# Patient Record
Sex: Male | Born: 1945
Health system: Southern US, Community
[De-identification: ages and names within clinical notes are randomized; demographics above are authoritative.]

## PROBLEM LIST (undated history)

## (undated) ENCOUNTER — Emergency Department (HOSPITAL_BASED_OUTPATIENT_CLINIC_OR_DEPARTMENT_OTHER): Payer: Commercial Managed Care - HMO | Source: Home / Self Care

## (undated) DIAGNOSIS — E785 Hyperlipidemia, unspecified: Secondary | ICD-10-CM

## (undated) DIAGNOSIS — I10 Essential (primary) hypertension: Secondary | ICD-10-CM

## (undated) HISTORY — PX: ROTATOR CUFF REPAIR: SHX139

## (undated) HISTORY — DX: Hyperlipidemia, unspecified: E78.5

## (undated) HISTORY — PX: REVISION TOTAL HIP ARTHROPLASTY: SHX766

## (undated) HISTORY — DX: Essential (primary) hypertension: I10

---

## 2014-06-03 LAB — BASIC METABOLIC PANEL
BUN: 16 mg/dL (ref 4–21)
CREATININE: 1 mg/dL (ref 0.6–1.3)
Glucose: 100 mg/dL
Potassium: 4.7 mmol/L (ref 3.4–5.3)
Sodium: 147 mmol/L (ref 137–147)

## 2014-06-03 LAB — LIPID PANEL
Cholesterol: 153 mg/dL (ref 0–200)
HDL: 55 mg/dL (ref 35–70)
LDL CALC: 71 mg/dL
Triglycerides: 135 mg/dL (ref 40–160)

## 2014-06-03 LAB — HEPATIC FUNCTION PANEL
ALT: 41 U/L — AB (ref 10–40)
AST: 30 U/L (ref 14–40)
Alkaline Phosphatase: 76 U/L (ref 25–125)
Bilirubin, Total: 0.7 mg/dL

## 2014-06-03 LAB — CBC AND DIFFERENTIAL
HEMATOCRIT: 43 % (ref 41–53)
HEMOGLOBIN: 14.2 g/dL (ref 13.5–17.5)
Platelets: 260 10*3/uL (ref 150–399)
WBC: 7.6 10*3/mL

## 2014-08-05 ENCOUNTER — Ambulatory Visit (INDEPENDENT_AMBULATORY_CARE_PROVIDER_SITE_OTHER): Payer: Commercial Managed Care - HMO | Admitting: Sports Medicine

## 2014-08-05 ENCOUNTER — Encounter: Payer: Self-pay | Admitting: Sports Medicine

## 2014-08-05 VITALS — BP 158/89 | HR 77 | Ht 72.0 in | Wt 226.0 lb

## 2014-08-05 DIAGNOSIS — F320 Major depressive disorder, single episode, mild: Secondary | ICD-10-CM | POA: Diagnosis not present

## 2014-08-05 DIAGNOSIS — F329 Major depressive disorder, single episode, unspecified: Secondary | ICD-10-CM | POA: Insufficient documentation

## 2014-08-05 DIAGNOSIS — Z96641 Presence of right artificial hip joint: Secondary | ICD-10-CM | POA: Insufficient documentation

## 2014-08-05 DIAGNOSIS — Z0000 Encounter for general adult medical examination without abnormal findings: Secondary | ICD-10-CM | POA: Insufficient documentation

## 2014-08-05 DIAGNOSIS — C61 Malignant neoplasm of prostate: Secondary | ICD-10-CM | POA: Diagnosis not present

## 2014-08-05 DIAGNOSIS — Z96642 Presence of left artificial hip joint: Secondary | ICD-10-CM | POA: Insufficient documentation

## 2014-08-05 DIAGNOSIS — E785 Hyperlipidemia, unspecified: Secondary | ICD-10-CM

## 2014-08-05 DIAGNOSIS — M1611 Unilateral primary osteoarthritis, right hip: Secondary | ICD-10-CM | POA: Diagnosis not present

## 2014-08-05 DIAGNOSIS — I10 Essential (primary) hypertension: Secondary | ICD-10-CM | POA: Insufficient documentation

## 2014-08-05 MED ORDER — TRAMADOL HCL 50 MG PO TABS: ORAL_TABLET | ORAL | Status: DC

## 2014-08-05 MED ORDER — MELOXICAM 15 MG PO TABS: ORAL_TABLET | ORAL | Status: DC

## 2014-08-05 MED ORDER — TRAZODONE HCL 100 MG PO TABS: 100.0000 mg | ORAL_TABLET | Freq: Every day | ORAL | Status: DC

## 2014-08-05 MED ORDER — VENLAFAXINE HCL ER 37.5 MG PO CP24: ORAL_CAPSULE | ORAL | Status: DC

## 2014-08-05 MED ORDER — VENLAFAXINE HCL ER 75 MG PO CP24: 75.0000 mg | ORAL_CAPSULE | Freq: Every day | ORAL | Status: DC

## 2014-08-05 MED ORDER — ATORVASTATIN CALCIUM 40 MG PO TABS: 40.0000 mg | ORAL_TABLET | Freq: Every day | ORAL | Status: DC

## 2014-08-05 MED ORDER — LOSARTAN POTASSIUM 100 MG PO TABS: 100.0000 mg | ORAL_TABLET | Freq: Every day | ORAL | Status: DC

## 2014-08-05 MED ORDER — OMEPRAZOLE 40 MG PO CPDR: 40.0000 mg | DELAYED_RELEASE_CAPSULE | Freq: Every day | ORAL | Status: DC

## 2014-08-05 NOTE — Assessment & Plan Note (Signed)
Overall doing well but very weak

## 2014-08-05 NOTE — Assessment & Plan Note (Signed)
Up-to-date on colon cancer screening. We will discuss vaccinations at a future visit

## 2014-08-05 NOTE — Assessment & Plan Note (Signed)
Patient declines any further treatment or monitoring of his prostate cancer. Risks, benefits, alternatives explained, including risk of death and metastatic disease. Most recent PSA was 5.4 in April 2016. Patient declines referral to another urologist.

## 2014-08-05 NOTE — Assessment & Plan Note (Signed)
Currently well-controlled, refilling medication.

## 2014-08-05 NOTE — Progress Notes (Signed)
  Subjective:    CC: Establish care.   HPI:  Depression: For the past several months as noted moderate anhedonia, poor energy, poor appetite, mild depressed mood and mild guilt, no suicidal or homicidal ideation, amenable to starting medication.  Prostate cancer: Noted on biopsy, he has not been to see his urologist, he does not want any further treatment, and declines any further monitoring of his prostate cancer. He declines any further referral to urology. He understands the risks.  Right hip pain: Known hip osteoarthritis, persistent pain despite oral ibuprofen. Moderate, persistent, localized at the groin. Worse in the morning and with weightbearing. Left hip is post-arthroplasty  Past medical history, Surgical history, Family history not pertinant except as noted below, Social history, Allergies, and medications have been entered into the medical record, reviewed, and no changes needed.   Review of Systems: No headache, visual changes, nausea, vomiting, diarrhea, constipation, dizziness, abdominal pain, skin rash, fevers, chills, night sweats, swollen lymph nodes, weight loss, chest pain, body aches, joint swelling, muscle aches, shortness of breath, mood changes, visual or auditory hallucinations.  Objective:    General: Well Developed, well nourished, and in no acute distress.  Neuro: Alert and oriented x3, extra-ocular muscles intact, sensation grossly intact.  HEENT: Normocephalic, atraumatic, pupils equal round reactive to light, neck supple, no masses, no lymphadenopathy, thyroid nonpalpable.  Skin: Warm and dry, no rashes noted.  Cardiac: Regular rate and rhythm, no murmurs rubs or gallops.  Respiratory: Clear to auscultation bilaterally. Not using accessory muscles, speaking in full sentences.  Abdominal: Soft, nontender, nondistended, positive bowel sounds, no masses, no organomegaly.  Right Hip: ROM IR: 30 Deg, painful, ER: 60 Deg, Flexion: 120 Deg, Extension: 100 Deg,  Abduction: 45 Deg, Adduction: 45 Deg Strength IR: 5/5, ER: 5/5, Flexion: 5/5, Extension: 5/5, Abduction: 5/5, Adduction: 5/5 Pelvic alignment unremarkable to inspection and palpation. Standing hip rotation and gait without trendelenburg / unsteadiness. Greater trochanter without tenderness to palpation. No tenderness over piriformis. No SI joint tenderness and normal minimal SI movement.  Procedure: Real-time Ultrasound Guided Injection of right femoral acetabular joint Device: GE Logiq E  Verbal informed consent obtained.  Time-out conducted.  Noted no overlying erythema, induration, or other signs of local infection.  Skin prepped in a sterile fashion.  Local anesthesia: Topical Ethyl chloride.  With sterile technique and under real time ultrasound guidance:  Spinal needle advanced to the femoral head/neck junction, 2 mL kenalog 40, 4 mL lidocaine injected easily. Completed without difficulty  Pain immediately resolved suggesting accurate placement of the medication.  Advised to call if fevers/chills, erythema, induration, drainage, or persistent bleeding.  Images permanently stored and available for review in the ultrasound unit.  Impression: Technically successful ultrasound guided injection.  Impression and Recommendations:    The patient was counselled, risk factors were discussed, anticipatory guidance given.  I spent 60 minutes with this patient, greater than 50% was face-to-face time counseling regarding the multiple above diagnoses, and separate from the time used for the injection.

## 2014-08-05 NOTE — Assessment & Plan Note (Signed)
Physical therapy, meloxicam, tramadol. Femoral acetabular joint injection as above.

## 2014-08-05 NOTE — Assessment & Plan Note (Signed)
Elevated but in significant pain today. Refilling medication. Renal function was predicted not too long ago, in April of this year

## 2014-08-05 NOTE — Assessment & Plan Note (Signed)
Starting Effexor. Significant anhedonia, lack of energy. We will try to PHQ9 at each visit.

## 2014-09-02 ENCOUNTER — Ambulatory Visit (INDEPENDENT_AMBULATORY_CARE_PROVIDER_SITE_OTHER): Payer: Medicare HMO | Admitting: Sports Medicine

## 2014-09-02 ENCOUNTER — Encounter: Payer: Self-pay | Admitting: Sports Medicine

## 2014-09-02 VITALS — BP 157/97 | HR 92 | Ht 72.0 in | Wt 217.0 lb

## 2014-09-02 DIAGNOSIS — F320 Major depressive disorder, single episode, mild: Secondary | ICD-10-CM

## 2014-09-02 DIAGNOSIS — I10 Essential (primary) hypertension: Secondary | ICD-10-CM | POA: Diagnosis not present

## 2014-09-02 DIAGNOSIS — M1611 Unilateral primary osteoarthritis, right hip: Secondary | ICD-10-CM

## 2014-09-02 MED ORDER — VALSARTAN 320 MG PO TABS: 320.0000 mg | ORAL_TABLET | Freq: Every day | ORAL | Status: DC

## 2014-09-02 MED ORDER — VENLAFAXINE HCL ER 150 MG PO CP24: 150.0000 mg | ORAL_CAPSULE | Freq: Every day | ORAL | Status: DC

## 2014-09-02 NOTE — Assessment & Plan Note (Signed)
Doing extremely well and pain-free post femoral acetabular joint injection. Did not do physical therapy. Placing an additional referral.

## 2014-09-02 NOTE — Assessment & Plan Note (Signed)
Persistently elevated. Switching to valsartan. Return in one month to recheck.

## 2014-09-02 NOTE — Progress Notes (Signed)
  Subjective:    CC: Follow-up  HPI: Primary right posterior arthritis: Doing well post hip joint injection. Has not yet done physical therapy.  Depression: Now only has moderate anhedonia, mild poor energy, moderate change in appetite and no suicidal or homicidal ideation on Effexor.  Hypertension: Persistently elevated, currently on losartan 100.  Past medical history, Surgical history, Family history not pertinant except as noted below, Social history, Allergies, and medications have been entered into the medical record, reviewed, and no changes needed.   Review of Systems: No fevers, chills, night sweats, weight loss, chest pain, or shortness of breath.   Objective:    General: Well Developed, well nourished, and in no acute distress.  Neuro: Alert and oriented x3, extra-ocular muscles intact, sensation grossly intact.  HEENT: Normocephalic, atraumatic, pupils equal round reactive to light, neck supple, no masses, no lymphadenopathy, thyroid nonpalpable.  Skin: Warm and dry, no rashes. Cardiac: Regular rate and rhythm, no murmurs rubs or gallops, no lower extremity edema.  Respiratory: Clear to auscultation bilaterally. Not using accessory muscles, speaking in full sentences.  Impression and Recommendations:

## 2014-09-02 NOTE — Assessment & Plan Note (Addendum)
Good improvement. Increasing Effexor to 150, PHQ9 in one month.

## 2014-09-04 ENCOUNTER — Ambulatory Visit (INDEPENDENT_AMBULATORY_CARE_PROVIDER_SITE_OTHER): Payer: Medicare HMO | Admitting: Rehabilitative and Restorative Service Providers"

## 2014-09-04 ENCOUNTER — Encounter: Payer: Self-pay | Admitting: Rehabilitative and Restorative Service Providers"

## 2014-09-04 VITALS — BP 178/112

## 2014-09-04 DIAGNOSIS — M2560 Stiffness of unspecified joint, not elsewhere classified: Secondary | ICD-10-CM | POA: Diagnosis not present

## 2014-09-04 DIAGNOSIS — M25551 Pain in right hip: Secondary | ICD-10-CM

## 2014-09-04 DIAGNOSIS — Z7409 Other reduced mobility: Secondary | ICD-10-CM

## 2014-09-04 DIAGNOSIS — M6289 Other specified disorders of muscle: Secondary | ICD-10-CM | POA: Diagnosis not present

## 2014-09-04 DIAGNOSIS — R6889 Other general symptoms and signs: Secondary | ICD-10-CM

## 2014-09-04 DIAGNOSIS — R29898 Other symptoms and signs involving the musculoskeletal system: Secondary | ICD-10-CM

## 2014-09-04 DIAGNOSIS — R531 Weakness: Secondary | ICD-10-CM

## 2014-09-04 NOTE — Patient Instructions (Signed)
Hamstring Step 1   Straighten one knee.bend the opposite knee. Use belt or dog lease to lift leg Hold _30__ seconds. Relax knee by returning foot to start. Repeat _3__ times. 2-3 times/day  Bridging   Slowly raise buttocks from floor, keeping stomach tight. Repeat __10__ times per set. Hold 10 sec  Do __1-3__ sets per session. Do _2___ sessions per day.   Strengthening: Wall Slide   Back against wall, slowly lower buttocks until thighs are parallel to floor. Hold 10-20___ seconds. Push back up to straight. Repeat __10__ times per set. Do _1-3___ sets per session. Do _2-3___ sessions per day.    In Pool  Shallow knee bends 10-20; Side kick outs leading with heel 10-20; Kick back 10-20; Walking in the water - forward and backwards - 5 min .

## 2014-09-04 NOTE — Therapy (Addendum)
[facility] [place], [place], [address] Phone: [phone]   Fax:  [phone]  Physical Therapy Evaluation  Patient Details  Name: Michael Conrad MRN: [id number] Date of Birth: [birth year] Referring Provider:  Silverio Decamp,*  Encounter Date: 09/04/2014      PT End of Session - 09/04/14 0925    Visit Number 1   Number of Visits 16   Date for PT Re-Evaluation 10/30/14   PT Start Time 0816   PT Stop Time 0925   PT Time Calculation (min) 69 min   Activity Tolerance Patient tolerated treatment well;No increased pain      Past Medical History  Diagnosis Date  . Hypertension   . Hyperlipidemia     Past Surgical History  Procedure Laterality Date  . Rotator cuff repair    . Revision total hip arthroplasty      Filed Vitals:   09/04/14 1124  BP: 178/112    Visit Diagnosis:  Hip pain, right - Plan: PT plan of care cert/re-cert  Weakness of both hips - Plan: PT plan of care cert/re-cert  Stiffness in joint - Plan: PT plan of care cert/re-cert  Decreased strength, endurance, and mobility - Plan: PT plan of care cert/re-cert      Subjective Assessment - 09/04/14 0820    Subjective Patient presents with c/o pain in the Rt hipfor the past couple of years. Dx with OA. Some difficulty ascending/descending steps. Tends to hold his breath with physicla activity. Has an occasional sharp pain in the Rt groin. He has Lt THA. Received injection into the Rt hip ~2 weeks ago with minimal improvement.    Pertinent History Lt THA 2010; fx bones in Rt foot/laceration of Rt foot years ago; DDD lumbar spine   How long can you sit comfortably? 1 hour - in recliner   How long can you stand comfortably? 20 min   How long can you walk comfortably? 15-20 min - does not walk much   Diagnostic tests xrays - OA Rt hip   Patient Stated Goals strengthening hips   Currently in Pain? Yes   Pain Score 5    Pain  Location Back   Pain Orientation Lower;Mid   Pain Descriptors / Indicators Dull;Aching   Pain Type Chronic pain   Pain Onset More than a month ago   Pain Frequency Intermittent   Aggravating Factors  getting up in the am - somedays   Pain Relieving Factors exercise/stretching/meds   Effect of Pain on Daily Activities sedentary            [facility] PT Assessment - 09/04/14 0001    Assessment   Medical Diagnosis OA Rt hip/THA Lt hip   Onset Date/Surgical Date 11/27/14   Hand Dominance Left   Next MD Visit 10/03/14   Precautions   Precaution Comments HTN - increased BP with exercise/activity - tends to hold his breath - will feel "shakely" with effort   Balance Screen   Has the patient fallen in the past 6 months No   Has the patient had a decrease in activity level because of a fear of falling?  No   Is the patient reluctant to leave their home because of a fear of falling?  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   Leisure sedentary - does have an above ground pool - does some yard work/house work   Observation/Other Assessments   Focus on Therapeutic Outcomes (FOTO)  37% limitation   Sensation   Additional Comments WNL to light touch   Posture/Postural Control   Posture Comments head forward; shoulders rounded and elevated; forward flexed at hips    AROM   Right Hip Extension 10   Right Hip Flexion 110   Right Hip External Rotation  45   Right Hip Internal Rotation  25   Right Hip ABduction 44   Left Hip Extension 15   Left Hip Flexion 124   Left Hip External Rotation  45   Left Hip Internal Rotation  30   Left  Hip ABduction 45   Strength   Right Hip Flexion 4-/5   Right Hip Extension 3+/5   Right Hip ABduction 4-/5   Right Hip ADduction 4/5   Left Hip Flexion 5/5   Left Hip Extension 4+/5   Left Hip ABduction 4+/5   Left Hip ADduction 5/5   Flexibility   Soft Tissue Assessment /Muscle Length --  HS tightness Rt 60/Lt 66   Palpation   Palpation comment tender/tight LB   Special Tests    Special Tests --  tight Rt figure 4 and piriformis testing   Ambulation/Gait   Gait Comments forward flexed at trunk with gait               [facility] Adult PT Treatment/Exercise - 09/04/14 0001    Knee/Hip Exercises: Stretches   Passive Hamstring Stretch 3 reps;30 seconds  with strap   Knee/Hip Exercises: Standing   Wall Squat 1 set;5 reps;10 seconds   Knee/Hip Exercises: Supine   Bridges Strengthening;Both;10 reps   Cryotherapy   Number Minutes Cryotherapy 15 Minutes   Cryotherapy Location --  bilat hips   Type of Cryotherapy Ice pack                PT Education - 09/04/14 0914    Education provided Yes   Education Details Improtance of exercise; gradually increasing exercises; HEP   Person(s) Educated Patient   Methods Explanation;Demonstration;Tactile cues;Verbal cues;Handout   Comprehension Verbalized understanding;Returned demonstration;Verbal cues required;Tactile cues required          PT Short Term Goals - 09/04/14 1127    PT SHORT TERM GOAL #4   Title --   Time --   Period --   Status --           PT Long Term Goals - 09/04/14 1128    PT LONG TERM GOAL #1   Title Patient I in HEP for discharge - to transition for [facility] 10/30/14   Time 8   Period Weeks   Status New   PT LONG TERM GOAL #2   Title Increase strength Bilat hips to 5-/5 to 5/5 10/30/14   Time 8   Period Weeks   Status New   PT LONG TERM GOAL #3   Title Increase hip flexioin to 120 degrees bilat 10/30/14   Time 8   Period Weeks   Status New   PT LONG TERM  GOAL #4   Title Decrease FOTO to </= 32% limitation  10/30/14   Time 8   Period Weeks   Status New               Plan - 09/04/14 9977    Clinical Impression Statement Patient presents with decreased activity level; Rt hip pain; bilateral hip weakness; limited functional endurance and activity level following THA Lt hip/with OA Rt hip. Abnormal   Pt will benefit from skilled therapeutic intervention in order to improve on the following deficits Pain;Decreased range of motion;Decreased strength;Decreased activity tolerance;Difficulty walking   Rehab Potential Good   PT Frequency 2x / week   PT Duration 8 weeks   PT Treatment/Interventions Patient/family education;ADLs/Self Care Home Management;Therapeutic exercise;Therapeutic activities;Balance training;Neuromuscular re-education;Cryotherapy;Electrical Stimulation;Moist Heat;Ultrasound;Gait training   PT Next Visit Plan Review exercise; add Nustep; work on posture and alignment; progress exercise program and make recommendations for home program   PT Home Exercise Plan HEP including pool exercise program   Consulted and Agree with Plan of Care Patient         Problem List Patient Active Problem List   Diagnosis Date Noted  . Primary osteoarthritis of right hip 08/05/2014  . History of total left hip arthroplasty 08/05/2014  . Prostate cancer 08/05/2014  . Annual physical exam 08/05/2014  . Major depression 08/05/2014  . Essential hypertension, benign 08/05/2014  . Hyperlipidemia 08/05/2014    Lataja Newland Nilda Simmer, PT, MPH 09/04/2014, 11:37 AM  [facility] [place], [place], [address] Phone: [phone]   Fax:  [phone]    PHYSICAL THERAPY DISCHARGE SUMMARY  Visits from Start of Care: Eval only  Current functional level related to goals / functional outcomes: Elevated blood pressure - unable to tolerate exercise program. Provided low level exercise  in supine position.    Remaining deficits: Unchanged   Education / Equipment: HEP  Plan: Patient agrees to discharge.  Patient goals were not met. Patient is being discharged due to the patient's request.  ?????    Evva Din P. Helene Kelp PT, MPH 10/03/2014 1:51 PM

## 2014-09-08 ENCOUNTER — Encounter: Payer: Commercial Managed Care - HMO | Admitting: Rehabilitative and Restorative Service Providers"

## 2014-09-08 ENCOUNTER — Encounter: Payer: Self-pay | Admitting: Rehabilitative and Restorative Service Providers"

## 2014-09-08 NOTE — Therapy (Signed)
[place], [place], [address] Phone: [phone]   Fax:  [phone]  Patient Details  Name: Michael Conrad MRN: [id number] Date of Birth: Apr 24, 1945 Referring Provider:  No ref. provider found  Encounter Date: 09/08/2014   Patient came to the clinic today to cancel his appointment stating that he was having problems with his blood pressure and did not feel comfortable participating in Physical Therapy until his blood pressure was under control. Dr. Darene Lamer gave him a new prescription for BP which he should pick up today.  [name] also states that he has had increased back and hip pain from the exercises he has tried at home. He did the three exercises as instructed and also did some exercise in his pool at home Thursday and Friday. He experienced increased pain Saturday and Sunday. He does not want to continue with therapy until her has his blood pressure under control.   E-mail was sent to Dr. Dianah Field to inform him of Michael Conrad's elevated blood pressure with exercise and his desire to hold PT until his BP is under control.    [name], PT, MPH 09/08/2014, 12:06 PM  [facility] [name], [place], [address] Phone: [phone]   Fax:  [phone]

## 2014-09-09 ENCOUNTER — Other Ambulatory Visit: Payer: Self-pay | Admitting: *Deleted

## 2014-09-09 DIAGNOSIS — I10 Essential (primary) hypertension: Secondary | ICD-10-CM

## 2014-09-09 MED ORDER — VALSARTAN 320 MG PO TABS: 320.0000 mg | ORAL_TABLET | Freq: Every day | ORAL | Status: DC

## 2014-09-10 ENCOUNTER — Encounter: Payer: Commercial Managed Care - HMO | Admitting: Physical Therapy

## 2014-09-30 ENCOUNTER — Ambulatory Visit (INDEPENDENT_AMBULATORY_CARE_PROVIDER_SITE_OTHER): Payer: Medicare HMO | Admitting: Sports Medicine

## 2014-09-30 ENCOUNTER — Encounter: Payer: Self-pay | Admitting: Sports Medicine

## 2014-09-30 VITALS — BP 130/80 | HR 93 | Ht 72.0 in | Wt 219.0 lb

## 2014-09-30 DIAGNOSIS — M1611 Unilateral primary osteoarthritis, right hip: Secondary | ICD-10-CM | POA: Diagnosis not present

## 2014-09-30 DIAGNOSIS — I10 Essential (primary) hypertension: Secondary | ICD-10-CM | POA: Diagnosis not present

## 2014-09-30 DIAGNOSIS — E785 Hyperlipidemia, unspecified: Secondary | ICD-10-CM | POA: Diagnosis not present

## 2014-09-30 DIAGNOSIS — F320 Major depressive disorder, single episode, mild: Secondary | ICD-10-CM | POA: Diagnosis not present

## 2014-09-30 LAB — CBC
HCT: 40.8 % (ref 39.0–52.0)
Hemoglobin: 13.9 g/dL (ref 13.0–17.0)
MCH: 30.4 pg (ref 26.0–34.0)
MCHC: 34.1 g/dL (ref 30.0–36.0)
MCV: 89.3 fL (ref 78.0–100.0)
MPV: 9.8 fL (ref 8.6–12.4)
Platelets: 263 K/uL (ref 150–400)
RBC: 4.57 MIL/uL (ref 4.22–5.81)
RDW: 13.5 % (ref 11.5–15.5)
WBC: 6.6 10*3/uL (ref 4.0–10.5)

## 2014-09-30 LAB — HEMOGLOBIN A1C
Hgb A1c MFr Bld: 6 % — ABNORMAL HIGH (ref <5.7)
Mean Plasma Glucose: 126 mg/dL — ABNORMAL HIGH (ref <117)

## 2014-09-30 LAB — COMPREHENSIVE METABOLIC PANEL WITH GFR
ALT: 31 U/L (ref 9–46)
Alkaline Phosphatase: 57 U/L (ref 40–115)
Calcium: 9.1 mg/dL (ref 8.6–10.3)
Chloride: 103 mmol/L (ref 98–110)
Total Protein: 6.6 g/dL (ref 6.1–8.1)

## 2014-09-30 LAB — COMPREHENSIVE METABOLIC PANEL
AST: 23 U/L (ref 10–35)
Albumin: 4.3 g/dL (ref 3.6–5.1)
BUN: 18 mg/dL (ref 7–25)
CO2: 24 mmol/L (ref 20–31)
Creat: 1.04 mg/dL (ref 0.70–1.25)
Glucose, Bld: 96 mg/dL (ref 65–99)
Potassium: 4.2 mmol/L (ref 3.5–5.3)
Sodium: 142 mmol/L (ref 135–146)
Total Bilirubin: 0.6 mg/dL (ref 0.2–1.2)

## 2014-09-30 LAB — LIPID PANEL
Cholesterol: 160 mg/dL (ref 125–200)
HDL: 49 mg/dL (ref >=40)
LDL Cholesterol: 80 mg/dL (ref <130)
Total CHOL/HDL Ratio: 3.3 Ratio (ref <=5.0)
Triglycerides: 156 mg/dL — ABNORMAL HIGH (ref <150)
VLDL: 31 mg/dL — ABNORMAL HIGH (ref <30)

## 2014-09-30 NOTE — Assessment & Plan Note (Signed)
Continues to be well controlled after injection several months ago

## 2014-09-30 NOTE — Progress Notes (Signed)
  Subjective:    CC: Follow-up  HPI:  Hip osteoarthritis: Continues to be resolved after injection several months ago  Depression: Now has only minimal anhedonia and difficulty with energy, all other symptoms are negative. Stable on Effexor 150.  Hypertension: Well controlled.  Past medical history, Surgical history, Family history not pertinant except as noted below, Social history, Allergies, and medications have been entered into the medical record, reviewed, and no changes needed.   Review of Systems: No fevers, chills, night sweats, weight loss, chest pain, or shortness of breath.   Objective:    General: Well Developed, well nourished, and in no acute distress.  Neuro: Alert and oriented x3, extra-ocular muscles intact, sensation grossly intact.  HEENT: Normocephalic, atraumatic, pupils equal round reactive to light, neck supple, no masses, no lymphadenopathy, thyroid nonpalpable.  Skin: Warm and dry, no rashes. Cardiac: Regular rate and rhythm, no murmurs rubs or gallops, no lower extremity edema.  Respiratory: Clear to auscultation bilaterally. Not using accessory muscles, speaking in full sentences.  Impression and Recommendations:

## 2014-09-30 NOTE — Assessment & Plan Note (Signed)
Well controlled, no changes 

## 2014-09-30 NOTE — Assessment & Plan Note (Signed)
Been fully controlled now on Effexor.

## 2014-09-30 NOTE — Assessment & Plan Note (Signed)
Stable on Lipitor, we will recheck blood work in 3 months.

## 2014-11-26 ENCOUNTER — Encounter: Payer: Self-pay | Admitting: Sports Medicine

## 2014-11-26 ENCOUNTER — Ambulatory Visit (INDEPENDENT_AMBULATORY_CARE_PROVIDER_SITE_OTHER): Payer: Medicare HMO | Admitting: Sports Medicine

## 2014-11-26 VITALS — BP 152/107 | HR 98 | Ht 72.0 in | Wt 223.0 lb

## 2014-11-26 DIAGNOSIS — F330 Major depressive disorder, recurrent, mild: Secondary | ICD-10-CM

## 2014-11-26 DIAGNOSIS — R5382 Chronic fatigue, unspecified: Secondary | ICD-10-CM

## 2014-11-26 DIAGNOSIS — Z23 Encounter for immunization: Secondary | ICD-10-CM | POA: Diagnosis not present

## 2014-11-26 DIAGNOSIS — C61 Malignant neoplasm of prostate: Secondary | ICD-10-CM | POA: Diagnosis not present

## 2014-11-26 NOTE — Assessment & Plan Note (Signed)
Michael Conrad has self discontinued his Effexor 150, and is not surprisingly having a recurrence of depressive symptoms. He would like to do an additional serum workup before considering an additional antidepressant.

## 2014-11-26 NOTE — Progress Notes (Signed)
  Subjective:    CC: Tired and fatigued  HPI: This is a pleasant 69 year old male with known clinical depression, we treated him with Effexor 150 mg and he did extremely well, recently he tried to self wean himself off, and unfortunately has worsening of fatigue type symptoms, specifically he notes severe poor energy, moderate anhedonia, changes in appetite and psychomotor retardation, mild depressed mood, and no suicidal or homicidal ideation. He is agreeable to check other sources of fatigue such as hypothyroidism, hypogonadism, anemia.  Past medical history, Surgical history, Family history not pertinant except as noted below, Social history, Allergies, and medications have been entered into the medical record, reviewed, and no changes needed.   Review of Systems: No fevers, chills, night sweats, weight loss, chest pain, or shortness of breath.   Objective:    General: Well Developed, well nourished, and in no acute distress.  Neuro: Alert and oriented x3, extra-ocular muscles intact, sensation grossly intact.  HEENT: Normocephalic, atraumatic, pupils equal round reactive to light, neck supple, no masses, no lymphadenopathy, thyroid nonpalpable.  Skin: Warm and dry, no rashes. Cardiac: Regular rate and rhythm, no murmurs rubs or gallops, no lower extremity edema.  Respiratory: Clear to auscultation bilaterally. Not using accessory muscles, speaking in full sentences.  Impression and Recommendations:    I spent 25 minutes with this patient, greater than 50% was face-to-face time counseling regarding the above diagnoses

## 2014-12-18 ENCOUNTER — Encounter: Payer: Self-pay | Admitting: Osteopathic Medicine

## 2014-12-18 ENCOUNTER — Encounter: Payer: Self-pay | Admitting: Sports Medicine

## 2014-12-18 ENCOUNTER — Ambulatory Visit (INDEPENDENT_AMBULATORY_CARE_PROVIDER_SITE_OTHER): Payer: Medicare HMO | Admitting: Osteopathic Medicine

## 2014-12-18 ENCOUNTER — Ambulatory Visit (INDEPENDENT_AMBULATORY_CARE_PROVIDER_SITE_OTHER): Payer: Medicare HMO | Admitting: Sports Medicine

## 2014-12-18 VITALS — BP 145/96 | HR 98 | Ht 72.0 in | Wt 225.0 lb

## 2014-12-18 DIAGNOSIS — M542 Cervicalgia: Secondary | ICD-10-CM | POA: Diagnosis not present

## 2014-12-18 DIAGNOSIS — M9901 Segmental and somatic dysfunction of cervical region: Secondary | ICD-10-CM | POA: Diagnosis not present

## 2014-12-18 DIAGNOSIS — M5030 Other cervical disc degeneration, unspecified cervical region: Secondary | ICD-10-CM | POA: Insufficient documentation

## 2014-12-18 DIAGNOSIS — H9319 Tinnitus, unspecified ear: Secondary | ICD-10-CM | POA: Insufficient documentation

## 2014-12-18 DIAGNOSIS — H9313 Tinnitus, bilateral: Secondary | ICD-10-CM | POA: Diagnosis not present

## 2014-12-18 DIAGNOSIS — M1611 Unilateral primary osteoarthritis, right hip: Secondary | ICD-10-CM

## 2014-12-18 LAB — TSH: TSH: 2.056 u[IU]/mL (ref 0.350–4.500)

## 2014-12-18 LAB — COMPREHENSIVE METABOLIC PANEL WITH GFR
BUN: 17 mg/dL (ref 7–25)
CO2: 26 mmol/L (ref 20–31)
Chloride: 104 mmol/L (ref 98–110)
Glucose, Bld: 98 mg/dL (ref 65–99)
Potassium: 4.1 mmol/L (ref 3.5–5.3)
Total Protein: 6.3 g/dL (ref 6.1–8.1)

## 2014-12-18 LAB — COMPREHENSIVE METABOLIC PANEL
ALT: 20 U/L (ref 9–46)
AST: 20 U/L (ref 10–35)
Albumin: 4.1 g/dL (ref 3.6–5.1)
Alkaline Phosphatase: 56 U/L (ref 40–115)
Calcium: 8.9 mg/dL (ref 8.6–10.3)
Creat: 0.93 mg/dL (ref 0.70–1.25)
Sodium: 140 mmol/L (ref 135–146)
Total Bilirubin: 0.7 mg/dL (ref 0.2–1.2)

## 2014-12-18 LAB — CBC
HCT: 42 % (ref 39.0–52.0)
Hemoglobin: 13.8 g/dL (ref 13.0–17.0)
MCH: 30.1 pg (ref 26.0–34.0)
MCHC: 32.9 g/dL (ref 30.0–36.0)
MCV: 91.7 fL (ref 78.0–100.0)
MPV: 9.9 fL (ref 8.6–12.4)
Platelets: 266 K/uL (ref 150–400)
RBC: 4.58 MIL/uL (ref 4.22–5.81)
RDW: 13.3 % (ref 11.5–15.5)
WBC: 6.5 10*3/uL (ref 4.0–10.5)

## 2014-12-18 LAB — TESTOSTERONE, FREE, TOTAL, SHBG
Sex Hormone Binding: 48 nmol/L (ref 22–77)
Testosterone, Free: 66 pg/mL (ref 47.0–244.0)
Testosterone-% Free: 1.6 % (ref 1.6–2.9)
Testosterone: 413 ng/dL (ref 300–890)

## 2014-12-18 LAB — PSA, TOTAL AND FREE
PSA, Free Pct: 11 % — ABNORMAL LOW (ref >25)
PSA, Free: 0.61 ng/mL
PSA: 5.58 ng/mL — ABNORMAL HIGH (ref <=4.00)

## 2014-12-18 MED ORDER — TRAMADOL HCL 50 MG PO TABS: ORAL_TABLET | ORAL | Status: DC

## 2014-12-18 NOTE — Assessment & Plan Note (Signed)
Likely simply presbycusis, no warning signs. We will keep and I on this, if it does not improve with treatment of his neck we will consider advanced imaging.

## 2014-12-18 NOTE — Progress Notes (Signed)
  Subjective:    CC: "neck pain and ear ringing"  HPI: Patient complains of a 1 month history of neck pain and bilateral ear ringing. The pain is located on the dorsum of his middle neck on the right side. It is constant and is worse with looking down. The pain does not radiate and is not associated with numbness. He has tried taking hydrocodone for the pain but this has given only mild relief. He does not note any preceeding trauma, heavy lifting, change in meds, or infection.  The ringing started the same day as the neck pain and is described as coming in "waves." He does not note anything that makes it worse or anything that makes it better. When the ringing is bad he does endorse "lightheadedness" (feeling a little unsteady) but does not endorse feeling that the room is spinning. He denies any falls, changes in his vision, or hearing loss. He has never had this before and has not had an infection or taken antibiotics for any reason recently. He denies ear drainage or ear pain. He says that the "bad waves" are not preceded by anything such as visual changes or headache.  Past medical history, Surgical history, Family history not pertinant except as noted below, Social history, Allergies, and medications have been entered into the medical record, reviewed, and no changes needed.   Review of Systems: No fevers, chills, night sweats, weight loss, chest pain, or shortness of breath.   Objective:    General: Well Developed, well nourished, and in no acute distress.  Neuro: Alert and oriented x3, extra-ocular muscles intact, sensation grossly intact, gait normal with no falls.  HEENT: Normocephalic, atraumatic, pupils equal round reactive to light, neck supple, no masses, no lymphadenopathy, thyroid nonpalpable, ear canal clear, TMs translucent bilaterally with no bulging or erythema, some otosclerosis noted bilaterally, right ear had small pimple that was non obstructive. Skin: Warm and dry, no  rashes. Cardiac: Regular rate and rhythm, no murmurs rubs or gallops, no lower extremity edema.  Respiratory: Clear to auscultation bilaterally. Not using accessory muscles, speaking in full sentences. Neck: Inspection unremarkable. No palpable stepoffs. Negative Spurling's maneuver. Full neck range of motion with pain upon full flexion of the neck. Grip strength and sensation normal in bilateral hands. Strength good C4 to T1 distribution. No sensory change to C4 to T1. Reflexes normal.   Impression and Recommendations:    Patient is a 69 y.o. male who presents today with 1 month of neck pain and ear ringing. While the ear ringing and pain began at the same time, it is more likely that the symptoms are separate processes. The neck pain is most likely cervical arthritis given the pain is positional, reproducible, and lack of focal neurologic deficit. No warning signs such as neurologic deficit, weight loss, chills, chest pain, or SOB. Will order x-ray for evaluation of c-spine joints and referred for OMM. Patient was given Tramadol for pain control.  Ear ringing is most likely due to presbycusis given his age, bilaterality, and lack of ear pain. He denies vertigo symptoms, hearing loss, and headache that would point towards meniere's or intracranial pathology. If symptoms worsen the patient was encouraged to come back into the office.

## 2014-12-18 NOTE — Progress Notes (Signed)
Procedure: OMT  CC: R neck pain  HPI: 1 month R neck pain, soreness, constant, no injury,no arm numbness  Exam: (+)TART changes R C spine level C3-5 tenderness and ropy texture to muscle. No skin changes. C3-4 SRRL.   ASSESSMENT/PLAN:  Segmental and somatic dysfunction of cervical region  OMT: applied myofascial release to occiput and to C-spine in neutral position and rotated R and L. Applied muscle energy to C-spine. Pt has some difficulty relaxing muscles, advised against HVLA since probably he will resist and exacerbate spasm.  Pt to followup with PT and imaging per Dr. Darene Lamer.

## 2014-12-18 NOTE — Addendum Note (Signed)
Addended by: Silverio Decamp on: 12/18/2014 10:04 AM   Modules accepted: Orders

## 2014-12-18 NOTE — Addendum Note (Signed)
Addended by: Maryla Morrow on: 12/18/2014 04:38 PM   Modules accepted: Level of Service

## 2014-12-18 NOTE — Assessment & Plan Note (Signed)
PSA in April 2016 was 5.4, now it's 5.58, low velocity, we will recheck in 6 months.

## 2014-12-18 NOTE — Assessment & Plan Note (Signed)
X-rays, tramadol per patient request, formal physical therapy, I would also like to refer him to Dr. Emeterio Reeve for osteopathic manipulation per patient request.

## 2014-12-19 ENCOUNTER — Telehealth: Payer: Self-pay

## 2014-12-19 NOTE — Telephone Encounter (Signed)
Patient aware of lab results and recommendations. 

## 2014-12-19 NOTE — Telephone Encounter (Signed)
-----   Message from Silverio Decamp, MD sent at 12/18/2014 10:00 AM EST ----- Labs look good, PSA is 5.58, we will recheck this in 6 months. Orders placed.

## 2014-12-23 ENCOUNTER — Ambulatory Visit (INDEPENDENT_AMBULATORY_CARE_PROVIDER_SITE_OTHER): Payer: Medicare HMO | Admitting: Sports Medicine

## 2014-12-23 ENCOUNTER — Ambulatory Visit (INDEPENDENT_AMBULATORY_CARE_PROVIDER_SITE_OTHER): Payer: Medicare HMO

## 2014-12-23 ENCOUNTER — Encounter: Payer: Self-pay | Admitting: Sports Medicine

## 2014-12-23 VITALS — BP 158/115 | HR 81 | Temp 97.8°F | Resp 18 | Wt 226.3 lb

## 2014-12-23 DIAGNOSIS — R4781 Slurred speech: Secondary | ICD-10-CM

## 2014-12-23 DIAGNOSIS — I10 Essential (primary) hypertension: Secondary | ICD-10-CM

## 2014-12-23 DIAGNOSIS — M542 Cervicalgia: Secondary | ICD-10-CM | POA: Diagnosis not present

## 2014-12-23 MED ORDER — LISINOPRIL-HYDROCHLOROTHIAZIDE 20-25 MG PO TABS: 1.0000 | ORAL_TABLET | Freq: Every day | ORAL | Status: DC

## 2014-12-23 NOTE — Assessment & Plan Note (Signed)
Persistently elevated, unable to afford Diovan, switching to lisinopril/HCTZ

## 2014-12-23 NOTE — Progress Notes (Signed)
  Subjective:    CC: Couple of issues  HPI: Neck pain: Had a single manipulation, and has not had physical therapy nor has he obtained his x-rays, still has pain in the neck not surprisingly, no trauma, no worsening. Nothing radicular.  Slurred speech: Had a single episode a few days ago of difficulty with finding words, as well as speaking in [place]. He does have hypertension, but denies any chest pain, visual changes.   Past medical history, Surgical history, Family history not pertinant except as noted below, Social history, Allergies, and medications have been entered into the medical record, reviewed, and no changes needed.   Review of Systems: No fevers, chills, night sweats, weight loss, chest pain, or shortness of breath.   Objective:    General: Well Developed, well nourished, and in no acute distress.  Neuro: Alert and oriented x3, extra-ocular muscles intact, sensation grossly intact. Cranial nerves II through XII are intact, motor, sensory and coordinate to functions are all intact, negative Romberg sign, no pronator drift. HEENT: Normocephalic, atraumatic, pupils equal round reactive to light, neck supple, no masses, no lymphadenopathy, thyroid nonpalpable.  Skin: Warm and dry, no rashes. Cardiac: Regular rate and rhythm, no murmurs rubs or gallops, no lower extremity edema.  Respiratory: Clear to auscultation bilaterally. Not using accessory muscles, speaking in full sentences.  Impression and Recommendations:    I spent 25 minutes with this patient, greater than 50% was face-to-face time counseling regarding the above diagnoses

## 2014-12-23 NOTE — Assessment & Plan Note (Signed)
Most likely related to cervical degenerative disc disease. Patient still needs x-rays, short-term relief from osteopathic manipulation by Dr. Sheppard Coil, may follow-up with her for further medication. Also needs to start formal physical therapy.

## 2014-12-23 NOTE — Assessment & Plan Note (Signed)
Starting CVA workup. Brain MRI, carotid Dopplers, echocardiogram was negative year ago. Blood pressure is elevated and we will work aggressively to bring it down.

## 2014-12-24 ENCOUNTER — Ambulatory Visit: Payer: Commercial Managed Care - HMO | Admitting: Sports Medicine

## 2014-12-24 ENCOUNTER — Other Ambulatory Visit: Payer: Self-pay

## 2014-12-24 DIAGNOSIS — I10 Essential (primary) hypertension: Secondary | ICD-10-CM

## 2014-12-24 MED ORDER — LISINOPRIL-HYDROCHLOROTHIAZIDE 20-25 MG PO TABS: 1.0000 | ORAL_TABLET | Freq: Every day | ORAL | Status: DC

## 2014-12-27 ENCOUNTER — Ambulatory Visit (HOSPITAL_BASED_OUTPATIENT_CLINIC_OR_DEPARTMENT_OTHER)
Admission: RE | Admit: 2014-12-27 | Discharge: 2014-12-27 | Disposition: A | Discharge status: Home / Self Care | Payer: Medicare HMO | Source: Ambulatory Visit | Attending: Sports Medicine | Admitting: Sports Medicine

## 2014-12-27 DIAGNOSIS — R51 Headache: Secondary | ICD-10-CM | POA: Diagnosis not present

## 2014-12-27 DIAGNOSIS — R4781 Slurred speech: Secondary | ICD-10-CM | POA: Insufficient documentation

## 2014-12-27 DIAGNOSIS — I10 Essential (primary) hypertension: Secondary | ICD-10-CM | POA: Diagnosis not present

## 2014-12-27 DIAGNOSIS — G319 Degenerative disease of nervous system, unspecified: Secondary | ICD-10-CM | POA: Insufficient documentation

## 2014-12-27 DIAGNOSIS — M542 Cervicalgia: Secondary | ICD-10-CM | POA: Insufficient documentation

## 2014-12-27 DIAGNOSIS — H9313 Tinnitus, bilateral: Secondary | ICD-10-CM | POA: Diagnosis not present

## 2014-12-27 DIAGNOSIS — R600 Localized edema: Secondary | ICD-10-CM | POA: Insufficient documentation

## 2014-12-27 DIAGNOSIS — I779 Disorder of arteries and arterioles, unspecified: Secondary | ICD-10-CM | POA: Diagnosis not present

## 2014-12-27 MED ORDER — GADOBENATE DIMEGLUMINE 529 MG/ML IV SOLN: 20.0000 mL | Freq: Once | INTRAVENOUS | Status: DC | PRN

## 2015-01-05 ENCOUNTER — Ambulatory Visit (INDEPENDENT_AMBULATORY_CARE_PROVIDER_SITE_OTHER): Payer: Medicare HMO | Admitting: Physical Therapy

## 2015-01-05 ENCOUNTER — Encounter: Payer: Self-pay | Admitting: Physical Therapy

## 2015-01-05 DIAGNOSIS — M542 Cervicalgia: Secondary | ICD-10-CM | POA: Diagnosis not present

## 2015-01-05 DIAGNOSIS — H9311 Tinnitus, right ear: Secondary | ICD-10-CM

## 2015-01-05 DIAGNOSIS — R29898 Other symptoms and signs involving the musculoskeletal system: Secondary | ICD-10-CM | POA: Diagnosis not present

## 2015-01-05 NOTE — Patient Instructions (Signed)
Head Press With [place] chin SLIGHTLY toward chest, keep mouth closed. Feel weight on back of head. Increase weight by pressing head down. Hold ___ seconds. Relax. Repeat ___ times.   Shoulder Press start with gentle presses    Press both shoulders down. Hold _5__ seconds. Repeat _10__ times. Once a day.   Scapula Adduction With Pectorals, Low   Stand in doorframe with palms against frame and arms at 45. Lean forward and squeeze shoulder blades. Hold _30__ seconds. Repeat _1-2__ times per session. Do _1__ sessions per day.  Scapula Adduction With Pectorals, Mid-Range   Stand in doorframe with palms against frame and arms at 90. Lean forward and squeeze shoulder blades. Hold _30__ seconds. Repeat _1-2__ times per session. Do __1_ sessions per day.

## 2015-01-05 NOTE — Therapy (Signed)
[place], [place], [address] Phone: [phone]   Fax:  [phone]  Physical Therapy Evaluation  Patient Details  Name: Michael Conrad MRN: [id number] Date of Birth: 1945-11-02 Referring Provider: Kem Boroughs  Encounter Date: 01/05/2015      PT End of Session - 01/05/15 1145    Visit Number 1   Number of Visits 3   Date for PT Re-Evaluation 01/26/15   Authorization Type will perform a 3 week trial of PT and see if it helps decrease his symptoms if not may benefit from audiologist assessment   PT Start Time 1105   PT Stop Time 1144   PT Time Calculation (min) 39 min   Activity Tolerance Patient tolerated treatment well      Past Medical History  Diagnosis Date  . Hypertension   . Hyperlipidemia     Past Surgical History  Procedure Laterality Date  . Rotator cuff repair    . Revision total hip arthroplasty      There were no vitals filed for this visit.  Visit Diagnosis:  Pain, neck - Plan: PT plan of care cert/re-cert  Weakness of right arm - Plan: PT plan of care cert/re-cert  Ringing in ears, right - Plan: PT plan of care cert/re-cert      Subjective Assessment - 01/05/15 1106    Subjective Pt reporrts Rt side neck pain with swelling and ringing in the ears. Has had hearing checked and it was WNL, Pain and ear ringing comes in waves   Pertinent History Lt THA 2010; fx bones in Rt foot/laceration of Rt foot years ago; DDD lumbar spine, reports he has a lot of cramping in his core, bilat shoulder RTC repairs 2 on Rt one on Lt.  all in 8 yrs   Diagnostic tests MRI shows arthritis in the neck, possible bulge    Patient Stated Goals start back at the [facility] to exercise. get the ringing to stop   Currently in Pain? Yes   Pain Score 10-Worst pain ever  this morning - before medication   Pain Location Neck   Pain Orientation Right   Pain Descriptors / Indicators Spasm   Pain Type  Acute pain   Pain Onset More than a month ago   Aggravating Factors  walking/sleeping   Pain Relieving Factors has tramadol for his hips and shoulders - seems to help some with his neck.             [facility] PT Assessment - 01/05/15 0001    Assessment   Medical Diagnosis neck pain   Referring Provider Thekkekandan   Onset Date/Surgical Date 11/10/14   Hand Dominance Left   Next MD Visit as needed   Precautions   Precaution Comments HTN - increased BP with exercise/activity - tends to hold his breath - will feel "shakely" with effort   Balance Screen   Has the patient fallen in the past 6 months No   Has the patient had a decrease in activity level because of a fear of falling?  No   Is the patient reluctant to leave their home because of a fear of falling?  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    Posture/Postural Control   Posture/Postural Control Postural limitations   Postural Limitations Rounded Shoulders;Forward head   ROM / Strength   AROM / PROM / Strength AROM;Strength   AROM   AROM Assessment Site Cervical;Shoulder   Right/Left Shoulder Right;Left  bilat WNL   Cervical Flexion WNL   Cervical Extension WNL   Cervical - Right Side Bend WNL   Cervical - Left Side Bend WNL   Cervical - Right Rotation WNL   Cervical - Left Rotation WNL   Strength   Overall Strength Comments bilat shoulders WNL except Rt shoulder ER 4/5   Strength Assessment Site --  mid traps 5-/5 developed spasm   Flexibility   Soft Tissue Assessment /Muscle  Length --  tight pecs bilat   Palpation   Spinal mobility hypomobile in c & t spine, pain T 3-8   Palpation comment TP in Rt UT/levator, tightness in bilat pecs   Special Tests    Special Tests --  (-) spurlings                   [facility] Adult PT Treatment/Exercise - 01/05/15 0001    Exercises   Exercises Neck   Neck Exercises: Standing   Other Standing Exercises doorway stretch with arms low.    Neck Exercises: Supine   Neck Retraction 10 reps  head presses into pillow   Other Supine Exercise 10 reps shoulder presses                 PT Education - 01/05/15 1136    Education provided Yes   Education Details HEP   Person(s) Educated Patient   Methods Explanation;Demonstration;Handout   Comprehension Returned demonstration          PT Short Term Goals - 09/04/14 1127    PT SHORT TERM GOAL #4   Title --   Time --   Period --   Status --           PT Long Term Goals - 01/05/15 1438    PT LONG TERM GOAL #1   Title Patient I in HEP and transition for [facility] 01/26/15   Time 3   Period Weeks   Status New   PT LONG TERM GOAL #2   Title increase strength Rt shoulder ER =/> 5-/5 (01/26/15)   Time 3   Period Weeks   Status New   PT LONG TERM GOAL #3   Title report symptom improvement =/> 75% ( 01/26/15)    Time 3   Period Weeks   Status New   PT LONG TERM GOAL #4   Title Demo FOTO =/< 10% limited ( 01/26/15)    Time 3   Period Weeks   Status New               Plan - 01/05/15 1435    Clinical Impression Statement 69 yo male with insidious onset of Rt sided neck pain, ringing in the ear and some dizziness.  He has had multiple tests that are inconclussive, he is referred to PT to see if this will decrease his symptoms.    Pt will benefit from skilled therapeutic intervention in order to improve on the following deficits Impaired UE functional use;Increased muscle spasms;Dizziness;Pain;Hypomobility;Decreased  strength;Postural dysfunction   Rehab Potential Good   PT Frequency 1x / week   PT Duration 3 weeks   PT Treatment/Interventions Ultrasound;Traction;Patient/family education;Dry needling;Cryotherapy;Electrical Stimulation;Iontophoresis 4mg /ml Dexamethasone;Moist Heat;Therapeutic exercise;Manual techniques   PT Next Visit Plan progress cervical stab ex, cervical and thoracic mobs.    Consulted and Agree with Plan of Care Patient         Problem List Patient Active Problem List   Diagnosis Date Noted  . Slurred speech 12/23/2014  . Neck pain 12/18/2014  . Tinnitus 12/18/2014  . Primary osteoarthritis of right hip 08/05/2014  . History of total left hip arthroplasty 08/05/2014  . Prostate cancer ([place]) 08/05/2014  . Annual physical exam 08/05/2014  . Major depression ([place]) 08/05/2014  . Essential hypertension, benign 08/05/2014  . Hyperlipidemia 08/05/2014    Jeral Pinch PT 01/05/2015, 2:47 PM  [facility] [place], [place], [address] Phone: [phone]   Fax:  [phone]  Name: Michael Conrad MRN: [id number] Date of Birth: 06/06/1945

## 2015-01-06 ENCOUNTER — Ambulatory Visit: Payer: Commercial Managed Care - HMO | Admitting: Sports Medicine

## 2015-01-12 ENCOUNTER — Encounter: Payer: Self-pay | Admitting: Rehabilitative and Restorative Service Providers"

## 2015-01-12 ENCOUNTER — Ambulatory Visit (INDEPENDENT_AMBULATORY_CARE_PROVIDER_SITE_OTHER): Payer: Medicare HMO | Admitting: Rehabilitative and Restorative Service Providers"

## 2015-01-12 DIAGNOSIS — R29898 Other symptoms and signs involving the musculoskeletal system: Secondary | ICD-10-CM

## 2015-01-12 DIAGNOSIS — M542 Cervicalgia: Secondary | ICD-10-CM

## 2015-01-12 DIAGNOSIS — H9311 Tinnitus, right ear: Secondary | ICD-10-CM

## 2015-01-12 NOTE — Patient Instructions (Signed)
   Modify position for sitting in the recliner to get out of the head forward position!  Try to lie down in a good neutral spine position with head supported on thin-ish pillow for 5 min every 1-2 hours during the day while you are at home.   Suboccipital Stretch (Supine)    With small towel roll at base of skull and upper neck. Gently tuck chin until stretch is felt at base of skull and upper neck. Hold _5-10__ seconds. Relax. Repeat _5-10___ times per set.  Do __3-4__ sessions per day.   Nodding yes/ nodding no  Lying in relaxed position with head supported  Gently moving head with head remaining supported on pillow 10-20 times each; 3-4 times/day

## 2015-01-12 NOTE — Therapy (Signed)
[place], [place], [address] Phone: [phone]   Fax:  [phone]  Physical Therapy Treatment  Patient Details  Name: Michael Conrad MRN: [id number] Date of Birth: [birth year] Referring Provider: Kem Boroughs  Encounter Date: 01/12/2015      PT End of Session - 01/12/15 1052    Visit Number 2   Number of Visits 3   Date for PT Re-Evaluation 01/26/15   Authorization Type will perform a 3 week trial of PT and see if it helps decrease his symptoms   PT Start Time 1052   PT Stop Time 1147   PT Time Calculation (min) 55 min   Activity Tolerance Patient tolerated treatment well      Past Medical History  Diagnosis Date  . Hypertension   . Hyperlipidemia     Past Surgical History  Procedure Laterality Date  . Rotator cuff repair    . Revision total hip arthroplasty      There were no vitals filed for this visit.  Visit Diagnosis:  Weakness of right arm  Pain, neck  Ringing in ears, right      Subjective Assessment - 01/12/15 1053    Subjective Pain in the Rt side of his neck; scalp is tender to the touch; ringing in the ear continues. Had cramping in the Lt chest and increased pain in the neck following last treatment. Has had ringing in his ears since he got out of bed this morning. Aleviated ringing with [place] to Rt suboccipital musculature and lateral/posterior cervical musculature with pain improved as well. Pain free with no ringing in ear with treatment. Started to notice ringing when he stood back up form supine positions but it was "not as loud"   Currently in Pain? Yes   Pain Score 8    Pain Location Neck   Pain Orientation Right   Pain Descriptors / Indicators Tightness   Pain Type Acute pain                         [facility] Adult PT Treatment/Exercise - 01/12/15 0001    Therapeutic Activites    Therapeutic Activities --  education re chair for  sitting(recliner) offered suggestions   Neuro Re-ed    Neuro Re-ed Details  working on position of head and neck in sititng and standing    Exercises   Exercises Neck   Neck Exercises: Standing   Other Standing Exercises doorway stretch with arms low.   cues needed    Neck Exercises: Seated   Neck Retraction Limitations gentle chin tuck to improve cervical position in sitting    Neck Exercises: Supine   Neck Retraction 5 reps;10 secs  chin tuck with head resting on pillow   Other Supine Exercise nodding yes x10; no x 10 head resting on pillow neutral position    Moist Heat Therapy   Number Minutes Moist Heat 15 Minutes   Moist Heat Location Cervical  cervical pre Rx 5'; post skull/cervical/thoracic post Rx 15'   Ultrasound   Ultrasound Location Rt suboccipital/cervical musculature    Ultrasound Parameters 3.20mHz; 1.5 w/cm2; 100%; 10 min    Ultrasound Goals Pain;Other (Comment)  muscle relaxation   Manual Therapy   Manual therapy comments pt sitting and supine    Joint Mobilization T spine sitting Grade 1/11; cervical CPA T1 - C2/3 Grade 1/11; UPA Rt C4/5 Grade 11   Soft tissue mobilization ant/lat/post cervical musculature; scaleni;  upper trap; leveator Rt > Lt working bilat   Myofascial Release posterior cervical/scalp; suboccipital inhibition    Passive ROM cervical stretch into flexioin through partial range straight and with slight rotation   Manual Traction cervical traction                PT Education - 01/12/15 1149    Education provided Yes   Education Details HEP; spine care; positioning; transitional movements; modification of recliner; occipital inhibitiion supine with golf balls   Person(s) Educated Patient   Methods Explanation;Demonstration;Tactile cues;Verbal cues;Handout   Comprehension Verbalized understanding;Returned demonstration;Verbal cues required;Tactile cues required             PT Long Term Goals - 01/05/15 1438    PT LONG TERM GOAL #1    Title Patient I in HEP and transition for [facility] at the [facility] 01/26/15   Time 3   Period Weeks   Status New   PT LONG TERM GOAL #2   Title increase strength Rt shoulder ER =/> 5-/5 (01/26/15)   Time 3   Period Weeks   Status New   PT LONG TERM GOAL #3   Title report symptom improvement =/> 75% ( 01/26/15)    Time 3   Period Weeks   Status New   PT LONG TERM GOAL #4   Title Demo FOTO =/< 10% limited ( 01/26/15)    Time 3   Period Weeks   Status New               Plan - 01/12/15 1300    Clinical Impression Statement Michael Conrad reports good resolution of tinnitus and cervical pain with treatment. Patient had some increase in the tinnitus upon standing but to lesser extent, indicating cervical tightness as a possible cause of wymptoms. He will schedule trial of dry needling. Verbalizes understanding of relevance of positions of head and neck and infulence on symptoms and states that he will modify his recliner - where he stays for most of the day when out of bed (very sedentary lifestyle). No goals accomplished - 2 nd visit.    Pt will benefit from skilled therapeutic intervention in order to improve on the following deficits Impaired UE functional use;Increased muscle spasms;Dizziness;Pain;Hypomobility;Decreased strength;Postural dysfunction   PT Frequency 1x / week   PT Duration 3 weeks   PT Treatment/Interventions Ultrasound;Traction;Patient/family education;Dry needling;Cryotherapy;Electrical Stimulation;Iontophoresis 4mg /ml Dexamethasone;Moist Heat;Therapeutic exercise;Manual techniques   PT Next Visit Plan progress cervical stab ex, cervical and thoracic mobs.    PT Home Exercise Plan axial extension in supine; gentle movement in supine; HEP; trial of occipital inhibition with golf balls    Consulted and Agree with Plan of Care Patient        Problem List Patient Active Problem List   Diagnosis Date Noted  . Slurred speech 12/23/2014  . Neck pain  12/18/2014  . Tinnitus 12/18/2014  . Primary osteoarthritis of right hip 08/05/2014  . History of total left hip arthroplasty 08/05/2014  . Prostate cancer ([place]) 08/05/2014  . Annual physical exam 08/05/2014  . Major depression ([place]) 08/05/2014  . Essential hypertension, benign 08/05/2014  . Hyperlipidemia 08/05/2014    Michael Conrad Michael Conrad PT, MPH  01/12/2015, 1:07 PM  [facility] [place], [place], [address] Phone: [phone]   Fax:  [phone]  Name: Michael Conrad MRN: [id number] Date of Birth: 1945-08-02

## 2015-01-20 ENCOUNTER — Ambulatory Visit (INDEPENDENT_AMBULATORY_CARE_PROVIDER_SITE_OTHER): Payer: Medicare HMO | Admitting: Physical Therapy

## 2015-01-20 ENCOUNTER — Telehealth: Payer: Self-pay

## 2015-01-20 DIAGNOSIS — H9311 Tinnitus, right ear: Secondary | ICD-10-CM | POA: Diagnosis not present

## 2015-01-20 DIAGNOSIS — M542 Cervicalgia: Secondary | ICD-10-CM

## 2015-01-20 DIAGNOSIS — H9313 Tinnitus, bilateral: Secondary | ICD-10-CM

## 2015-01-20 NOTE — Therapy (Addendum)
[place], [place], [address] Phone: [phone]   Fax:  [phone]  Physical Therapy Treatment  Patient Details  Name: Michael Conrad MRN: [id number] Date of Birth: 03/06/1945 Referring Provider: Dianah Field  Encounter Date: 01/20/2015      PT End of Session - 01/20/15 1003    Visit Number 3   Number of Visits 3   Date for PT Re-Evaluation 01/26/15   Authorization Type will perform a 3 week trial of PT and see if it helps decrease his symptoms   PT Start Time 0935   PT Stop Time 1017   PT Time Calculation (min) 42 min   Activity Tolerance Patient tolerated treatment well      Past Medical History  Diagnosis Date  . Hypertension   . Hyperlipidemia     Past Surgical History  Procedure Laterality Date  . Rotator cuff repair    . Revision total hip arthroplasty      There were no vitals filed for this visit.  Visit Diagnosis:  Pain, neck  Ringing in ears, right      Subjective Assessment - 01/20/15 0937    Subjective Pt reports he hasn't notiiced any change in his symptoms.    Currently in Pain? Yes   Pain Score 5    Pain Location Neck   Pain Orientation Right   Pain Descriptors / Indicators Tightness  " swelling " sensation, along with ringing in the ear   Pain Type Acute pain   Pain Onset More than a month ago   Pain Frequency Constant   Aggravating Factors  random, not sure what, lying down does make it worse.    Pain Relieving Factors nothing, trying to not take medication because he doesn't want to put a bandaide on it.             [facility] PT Assessment - 01/20/15 0001    Assessment   Medical Diagnosis neck pain   Referring Provider Thekkekandam   Onset Date/Surgical Date 11/10/14   Hand Dominance Left   Next MD Visit as needed   Observation/Other Assessments   Focus on Therapeutic Outcomes (FOTO)  30% limited                     [facility] Adult  PT Treatment/Exercise - 01/20/15 0001    Modalities   Modalities Electrical Stimulation;Moist Heat   Moist Heat Therapy   Number Minutes Moist Heat 15 Minutes   Moist Heat Location Cervical   Electrical Stimulation   Electrical Stimulation Location cervical   Electrical Stimulation Action IFC   Electrical Stimulation Parameters  to tolerance   Electrical Stimulation Goals Pain   Manual Therapy   Soft tissue mobilization Rt UT, periocciput and cervical paraspinals           Trigger Point Dry Needling - 01/20/15 0954    Consent Given? Yes   Education Handout Provided Yes   Muscles Treated Upper Body Oblique capitus;Upper trapezius  Rt side cervical multifidi   Upper Trapezius Response --  no response   Oblique Capitus Response Twitch response elicited;Palpable increased muscle length                   PT Long Term Goals - 01/20/15 1008    PT LONG TERM GOAL #1   Title Patient I in HEP and transition for [facility] at the [facility] 01/26/15   Status On-going   PT  LONG TERM GOAL #2   Title increase strength Rt shoulder ER =/> 5-/5 (01/26/15)   Status On-going   PT LONG TERM GOAL #3   Title report symptom improvement =/> 75% ( 01/26/15)    Status On-going   PT LONG TERM GOAL #4   Title Demo FOTO =/< 10% limited ( 01/26/15)    Status On-going  scored 30% limited, mainly due to shoulder pain               Plan - 01/20/15 1004    Clinical Impression Statement Pt reports decrease in ringing of the ears after treatment, was able to walk around for about 5' without symptoms returning.  Still not sure if tight cervical musculature is the cause of the ringing.    Pt will benefit from skilled therapeutic intervention in order to improve on the following deficits Impaired UE functional use;Increased muscle spasms;Dizziness;Pain;Hypomobility;Decreased strength;Postural dysfunction   Rehab Potential Good   PT Next Visit Plan if patient has lasting results and  relief he will call to schedule next week and we will write a renewal, if no difference we will d/c and recommend he see an audiologist.    Consulted and Agree with Plan of Care Patient        Problem List Patient Active Problem List   Diagnosis Date Noted  . Slurred speech 12/23/2014  . Neck pain 12/18/2014  . Tinnitus 12/18/2014  . Primary osteoarthritis of right hip 08/05/2014  . History of total left hip arthroplasty 08/05/2014  . Prostate cancer ([place]) 08/05/2014  . Annual physical exam 08/05/2014  . Major depression ([place]) 08/05/2014  . Essential hypertension, benign 08/05/2014  . Hyperlipidemia 08/05/2014    Jeral Pinch PT  01/20/2015, 10:10 AM  [facility] [place], [place], [address] Phone: [phone]   Fax:  [phone]  Name: Michael Conrad MRN: [id number] Date of Birth: 11/26/1945    PHYSICAL THERAPY DISCHARGE SUMMARY  Visits from Start of Care: 3 Current functional level related to goals / functional outcomes: See above   Remaining deficits: See above   Education / Equipment: HEP,recommend patient follow up with audiologist.  Plan: Patient agrees to discharge.  Patient goals were not met. Patient is being discharged due to lack of progress.  ?????    Jeral Pinch, PT 01/27/2015 12:36 PM

## 2015-01-20 NOTE — Telephone Encounter (Signed)
Michael Conrad complains he still has ringing in the ears and right side head pain. He would like to know what is the next step. Please advise.

## 2015-01-20 NOTE — Telephone Encounter (Signed)
With a negative stroke workup we have time now and know its nothing life threatening.  Tinnitus is common and doesn't always have a cure, I will send in a referral to ENT to get a second opinion but I think they will have the same answer.

## 2015-01-20 NOTE — Patient Instructions (Signed)

## 2015-04-23 ENCOUNTER — Encounter: Payer: Self-pay | Admitting: Sports Medicine

## 2015-04-23 ENCOUNTER — Ambulatory Visit (INDEPENDENT_AMBULATORY_CARE_PROVIDER_SITE_OTHER): Payer: Medicare HMO | Admitting: Sports Medicine

## 2015-04-23 VITALS — BP 129/96 | HR 108 | Resp 18 | Wt 225.8 lb

## 2015-04-23 DIAGNOSIS — M1611 Unilateral primary osteoarthritis, right hip: Secondary | ICD-10-CM

## 2015-04-23 DIAGNOSIS — F330 Major depressive disorder, recurrent, mild: Secondary | ICD-10-CM

## 2015-04-23 DIAGNOSIS — M5030 Other cervical disc degeneration, unspecified cervical region: Secondary | ICD-10-CM

## 2015-04-23 MED ORDER — TRAMADOL HCL 50 MG PO TABS
ORAL_TABLET | ORAL | Status: DC
Start: 2015-04-23 — End: 2015-10-27

## 2015-04-23 MED ORDER — CITALOPRAM HYDROBROMIDE 10 MG PO TABS: 10.0000 mg | ORAL_TABLET | Freq: Every day | ORAL | Status: DC

## 2015-04-23 NOTE — Assessment & Plan Note (Signed)
Refilling tramadol, cannot afford an MRI at this point

## 2015-04-23 NOTE — Assessment & Plan Note (Signed)
Persistence of depression, adding Celexa. Venlafaxine became too expensive. Return in one month.

## 2015-04-23 NOTE — Progress Notes (Signed)
  Subjective:    CC: follow-up  HPI: Cervical degenerative disc disease: Cannot afford an MRI, pain is moderately controlled with tramadol, needs a refill.  Depression: Uncontrolled, he has come off of his Effexor, endorses severe anhedonia, depressed mood, difficulty sleeping, poor energy, poor appetite, and mild guilt, no difficulty with concentration, no psychomotor retardation, and no suicidal or homicidal ideation. He does endorse significant forgetfulness.  Past medical history, Surgical history, Family history not pertinant except as noted below, Social history, Allergies, and medications have been entered into the medical record, reviewed, and no changes needed.   Review of Systems: No fevers, chills, night sweats, weight loss, chest pain, or shortness of breath.   Objective:    General: Well Developed, well nourished, and in no acute distress.  Neuro: Alert and oriented x3, extra-ocular muscles intact, sensation grossly intact.  HEENT: Normocephalic, atraumatic, pupils equal round reactive to light, neck supple, no masses, no lymphadenopathy, thyroid nonpalpable.  Skin: Warm and dry, no rashes. Cardiac: Regular rate and rhythm, no murmurs rubs or gallops, no lower extremity edema.  Respiratory: Clear to auscultation bilaterally. Not using accessory muscles, speaking in full sentences.  Impression and Recommendations:    I spent 25 minutes with this patient, greater than 50% was face-to-face time counseling regarding the above diagnoses

## 2015-05-15 ENCOUNTER — Ambulatory Visit: Payer: Medicare HMO | Admitting: Sports Medicine

## 2015-05-21 ENCOUNTER — Ambulatory Visit: Payer: Medicare HMO | Admitting: Sports Medicine

## 2015-05-25 ENCOUNTER — Other Ambulatory Visit: Payer: Self-pay | Admitting: Sports Medicine

## 2015-08-17 ENCOUNTER — Other Ambulatory Visit: Payer: Self-pay

## 2015-08-17 DIAGNOSIS — C61 Malignant neoplasm of prostate: Secondary | ICD-10-CM

## 2015-08-18 ENCOUNTER — Other Ambulatory Visit: Payer: Self-pay

## 2015-08-18 LAB — PSA, TOTAL AND FREE
PSA, Free Pct: 14 % — ABNORMAL LOW (ref >25)
PSA, Free: 0.73 ng/mL
PSA: 5.22 ng/mL — ABNORMAL HIGH (ref <=4.00)

## 2015-08-18 MED ORDER — TRAZODONE HCL 100 MG PO TABS: 100.0000 mg | ORAL_TABLET | Freq: Every day | ORAL | Status: DC

## 2015-08-20 ENCOUNTER — Telehealth: Payer: Self-pay | Admitting: Sports Medicine

## 2015-08-20 ENCOUNTER — Ambulatory Visit (INDEPENDENT_AMBULATORY_CARE_PROVIDER_SITE_OTHER): Payer: Medicare HMO | Admitting: Sports Medicine

## 2015-08-20 ENCOUNTER — Encounter: Payer: Self-pay | Admitting: Sports Medicine

## 2015-08-20 VITALS — BP 96/63 | HR 114 | Resp 16 | Wt 221.8 lb

## 2015-08-20 DIAGNOSIS — M5030 Other cervical disc degeneration, unspecified cervical region: Secondary | ICD-10-CM

## 2015-08-20 DIAGNOSIS — I10 Essential (primary) hypertension: Secondary | ICD-10-CM | POA: Diagnosis not present

## 2015-08-20 DIAGNOSIS — F330 Major depressive disorder, recurrent, mild: Secondary | ICD-10-CM

## 2015-08-20 NOTE — Progress Notes (Signed)
  Subjective:    CC: Follow-up  HPI: Hypertension: Has had a few episodes of orthostasis.  Depression: Has self discontinued Celexa, doing well.  Neck pain: Axial, discogenic, we have been trying to get an MRI but he has been unable to afford it at an outpatient facility, at this point he is agreeable to trying get it done at the [place]. Has finished several months of physical therapy, NSAIDs, muscle relaxers, no improvement.  Past medical history, Surgical history, Family history not pertinant except as noted below, Social history, Allergies, and medications have been entered into the medical record, reviewed, and no changes needed.   Review of Systems: No fevers, chills, night sweats, weight loss, chest pain, or shortness of breath.   Objective:    General: Well Developed, well nourished, and in no acute distress.  Neuro: Alert and oriented x3, extra-ocular muscles intact, sensation grossly intact.  HEENT: Normocephalic, atraumatic, pupils equal round reactive to light, neck supple, no masses, no lymphadenopathy, thyroid nonpalpable.  Skin: Warm and dry, no rashes. Cardiac: Regular rate and rhythm, no murmurs rubs or gallops, no lower extremity edema.  Respiratory: Clear to auscultation bilaterally. Not using accessory muscles, speaking in full sentences.  Impression and Recommendations:    I spent 25 minutes with this patient, greater than 50% was face-to-face time counseling regarding the above diagnoses

## 2015-08-20 NOTE — Assessment & Plan Note (Signed)
Somewhat orthostatic, decreasing to one half Daily and lisinopril/hydrochlorothiazide. Advised oral hydration.

## 2015-08-20 NOTE — Telephone Encounter (Signed)
Patient request to have anything on med [facility] that [facility] changed to [facility] mail order but he doesn't need anything filled except the Trazodone which was filled already on 08/18/15. Thanks

## 2015-08-20 NOTE — Assessment & Plan Note (Signed)
Has been using tramadol but at this point is agreeable to try to get the MRI done at the [place]. Return to go over MRI results. Pain is axial without radiculopathy, he has had x-rays done. Has failed greater than 6 weeks of physician directed rehabilitation.

## 2015-08-20 NOTE — Assessment & Plan Note (Signed)
Resolved, has self discontinued Celexa.

## 2015-10-01 ENCOUNTER — Telehealth: Payer: Self-pay | Admitting: Sports Medicine

## 2015-10-01 MED ORDER — OMEPRAZOLE 40 MG PO CPDR: 40.0000 mg | DELAYED_RELEASE_CAPSULE | Freq: Two times a day (BID) | ORAL | 3 refills | Status: DC

## 2015-10-01 NOTE — Telephone Encounter (Signed)
Prilosec sent in.

## 2015-10-01 NOTE — Telephone Encounter (Signed)
Pt came in and stated he just seen the ENT at the [facility] and they believe he has silent reflux and the pt would like to know if you can refill the Prilosec to where he is taking it twice daily bc that is what the ENT recommended. Also, he wanted to let you know he is getting an ultrasound on his throat because they found a nodule and one he gets that done he stated he would follow up with you. He left different results for you and those are in your basket. Thanks

## 2015-10-02 ENCOUNTER — Encounter: Payer: Self-pay | Admitting: Sports Medicine

## 2015-10-02 ENCOUNTER — Other Ambulatory Visit: Payer: Self-pay | Admitting: Sports Medicine

## 2015-10-02 DIAGNOSIS — J392 Other diseases of pharynx: Secondary | ICD-10-CM | POA: Insufficient documentation

## 2015-10-02 NOTE — Progress Notes (Signed)
Multilevel cervical degenerative disc disease on MRI, also incidentally noted was a asymmetry in the oropharynx that needs a CT of the neck with contrast to further evaluate for a mass lesion. Imaging ordered, patient will be contacted.

## 2015-10-02 NOTE — Assessment & Plan Note (Signed)
Incidentally noted on MRI of the cervical spine is asymmetry of the oropharynx on the highest axial slice, IV contrast enhanced CT of the neck ordered.

## 2015-10-05 ENCOUNTER — Telehealth: Payer: Self-pay | Admitting: Sports Medicine

## 2015-10-05 NOTE — Telephone Encounter (Signed)
-----   Message from Silverio Decamp, MD sent at 10/02/2015  4:47 PM EDT ----- There was a slight abnormality noted in the oropharynx in the upper slices of the cervical spine MRI, we need to proceed with neck CT with contrast for further evaluation to exclude a mass. Contact patient and let him know that we are going to schedule for the CT. ___________________________________________ Gwen Her. Dianah Field, M.D., ABFM., CAQSM. Primary Care and [place] Instructor of [place] [employer] [facility] [employer]

## 2015-10-05 NOTE — Telephone Encounter (Signed)
Spoke with Pt regarding CT order. Pt states he is already scheduled at the [facility] hospital for a US of the mass. Pt states he will wait on their recommendation after resulted. Will cancel current order from PCP.

## 2015-10-08 ENCOUNTER — Other Ambulatory Visit: Payer: Self-pay

## 2015-10-08 MED ORDER — OMEPRAZOLE 40 MG PO CPDR: 40.0000 mg | DELAYED_RELEASE_CAPSULE | Freq: Two times a day (BID) | ORAL | 3 refills | Status: DC

## 2015-10-09 ENCOUNTER — Other Ambulatory Visit: Payer: Self-pay

## 2015-10-09 ENCOUNTER — Telehealth: Payer: Self-pay | Admitting: Sports Medicine

## 2015-10-09 DIAGNOSIS — I10 Essential (primary) hypertension: Secondary | ICD-10-CM

## 2015-10-09 MED ORDER — LISINOPRIL-HYDROCHLOROTHIAZIDE 20-25 MG PO TABS
0.5000 | ORAL_TABLET | Freq: Every day | ORAL | 3 refills | Status: DC
Start: 2015-10-09 — End: 2015-10-22

## 2015-10-09 NOTE — Telephone Encounter (Signed)
Pt said he called yesterday & left a msg on CMA's vm. His script should've  been sent to [facility] and not [facility].  Thank you.

## 2015-10-22 ENCOUNTER — Encounter: Payer: Self-pay | Admitting: Sports Medicine

## 2015-10-22 ENCOUNTER — Ambulatory Visit (INDEPENDENT_AMBULATORY_CARE_PROVIDER_SITE_OTHER): Payer: Medicare HMO | Admitting: Sports Medicine

## 2015-10-22 DIAGNOSIS — G6289 Other specified polyneuropathies: Secondary | ICD-10-CM | POA: Diagnosis not present

## 2015-10-22 DIAGNOSIS — G629 Polyneuropathy, unspecified: Secondary | ICD-10-CM | POA: Insufficient documentation

## 2015-10-22 DIAGNOSIS — I10 Essential (primary) hypertension: Secondary | ICD-10-CM

## 2015-10-22 DIAGNOSIS — J392 Other diseases of pharynx: Secondary | ICD-10-CM | POA: Diagnosis not present

## 2015-10-22 DIAGNOSIS — Z23 Encounter for immunization: Secondary | ICD-10-CM

## 2015-10-22 MED ORDER — TRIAMCINOLONE ACETONIDE 0.5 % EX CREA
1.0000 | TOPICAL_CREAM | Freq: Two times a day (BID) | CUTANEOUS | 3 refills | Status: DC
Start: 2015-10-22 — End: 2016-07-25

## 2015-10-22 MED ORDER — GABAPENTIN 300 MG PO CAPS
ORAL_CAPSULE | ORAL | 3 refills | Status: DC
Start: 2015-10-22 — End: 2015-11-10

## 2015-10-22 MED ORDER — VALSARTAN-HYDROCHLOROTHIAZIDE 320-25 MG PO TABS: 0.5000 | ORAL_TABLET | Freq: Every day | ORAL | 3 refills | Status: DC

## 2015-10-22 NOTE — Progress Notes (Signed)
  Subjective:    CC: Follow-up  HPI: Pharyngeal mass: Has a biopsy coming up with the [place].  Hypertension: Well controlled on lisinopril/hydrochlorothiazide however he is developing a bit of a dry cough. Agreeable to switch to an ARB.  Foot pain: Left worse than right, there are some paresthesias with numbness and tingling on the plantar aspect. Would like a nerve pill.  Past medical history, Surgical history, Family history not pertinant except as noted below, Social history, Allergies, and medications have been entered into the medical record, reviewed, and no changes needed.   Review of Systems: No fevers, chills, night sweats, weight loss, chest pain, or shortness of breath.   Objective:    General: Well Developed, well nourished, and in no acute distress.  Neuro: Alert and oriented x3, extra-ocular muscles intact, sensation grossly intact.  HEENT: Normocephalic, atraumatic, pupils equal round reactive to light, neck supple, no masses, no lymphadenopathy, thyroid nonpalpable.  Skin: Warm and dry, no rashes. Cardiac: Regular rate and rhythm, no murmurs rubs or gallops, no lower extremity edema.  Respiratory: Clear to auscultation bilaterally. Not using accessory muscles, speaking in full sentences. Left Foot: No visible erythema or swelling. Range of motion is full in all directions. Strength is 5/5 in all directions. No hallux valgus. Pes planus No abnormal callus noted. No pain over the navicular prominence, or base of fifth metatarsal. No tenderness to palpation of the calcaneal insertion of plantar fascia. No pain at the Achilles insertion. No pain over the calcaneal bursa. No pain of the retrocalcaneal bursa. No tenderness to palpation over the tarsals, metatarsals, or phalanges. Mild hallux rigidus No tenderness palpation over interphalangeal joints. No pain with compression of the metatarsal heads. Neurovascularly intact distally.  Impression and Recommendations:     Peripheral neuropathy ([place]) Return for custom orthotics, adding low-dose gabapentin.  Essential hypertension, benign Well-controlled. He is getting a cough, switching to valsartan/HCTZ  Mass of pharynx Has a biopsy coming up with the [place]  I spent 25 minutes with this patient, greater than 50% was face-to-face time counseling regarding the above diagnoses

## 2015-10-22 NOTE — Assessment & Plan Note (Signed)
Well-controlled. He is getting a cough, switching to valsartan/HCTZ

## 2015-10-22 NOTE — Assessment & Plan Note (Signed)
Has a biopsy coming up with the [place]

## 2015-10-22 NOTE — Assessment & Plan Note (Signed)
Return for custom orthotics, adding low-dose gabapentin.

## 2015-10-27 ENCOUNTER — Encounter: Payer: Self-pay | Admitting: Sports Medicine

## 2015-10-27 ENCOUNTER — Other Ambulatory Visit: Payer: Self-pay | Admitting: Sports Medicine

## 2015-10-27 ENCOUNTER — Ambulatory Visit (INDEPENDENT_AMBULATORY_CARE_PROVIDER_SITE_OTHER): Payer: Medicare HMO | Admitting: Sports Medicine

## 2015-10-27 VITALS — BP 126/82 | HR 103 | Resp 16 | Wt 224.4 lb

## 2015-10-27 DIAGNOSIS — M1611 Unilateral primary osteoarthritis, right hip: Secondary | ICD-10-CM | POA: Diagnosis not present

## 2015-10-27 DIAGNOSIS — R319 Hematuria, unspecified: Secondary | ICD-10-CM

## 2015-10-27 DIAGNOSIS — G6289 Other specified polyneuropathies: Secondary | ICD-10-CM

## 2015-10-27 MED ORDER — TRAMADOL HCL 50 MG PO TABS: ORAL_TABLET | ORAL | 0 refills | Status: DC

## 2015-10-27 NOTE — Assessment & Plan Note (Signed)
Administrator as above.

## 2015-10-27 NOTE — Progress Notes (Signed)

## 2015-10-27 NOTE — Progress Notes (Signed)
Order(s) created erroneously. Erroneous order ID: [id number]  Order moved by: Milas Hock  Order move date/time: 10/27/2015 2:39 PM  Source Patient: [id number]  Source Contact: 10/27/2015  Destination Patient: [id number]  Destination Contact: 04/24/2012

## 2015-10-28 ENCOUNTER — Telehealth: Payer: Self-pay | Admitting: Sports Medicine

## 2015-10-28 NOTE — Telephone Encounter (Signed)
Patient called adv that he received after summary sheet that had his name on it but mentioned dip stick U/A on it and he had Orthotics done 10/27/15 nothing to do with urine sample at all. Thanks

## 2015-10-28 NOTE — Telephone Encounter (Signed)
That dipstick was added incorrectly, and was supposed to be in a different patient, please tell him this is removed and he will not be billed.

## 2015-10-28 NOTE — Telephone Encounter (Signed)
Left pt a detailed message

## 2015-11-10 ENCOUNTER — Telehealth: Payer: Self-pay

## 2015-11-10 MED ORDER — GABAPENTIN 300 MG PO CAPS: ORAL_CAPSULE | ORAL | 3 refills | Status: DC

## 2015-11-10 NOTE — Telephone Encounter (Signed)
Pt left VM and would like to know if he can increase his gabapentin because he doesn't feel any relief in his lefs. Please advise

## 2015-11-10 NOTE — Telephone Encounter (Signed)
Left message stating meds have been increased

## 2015-11-10 NOTE — Telephone Encounter (Signed)
Yes, and I have called in more with a specific up titration instructions.

## 2015-11-30 ENCOUNTER — Telehealth: Payer: Self-pay

## 2015-11-30 DIAGNOSIS — I10 Essential (primary) hypertension: Secondary | ICD-10-CM

## 2015-11-30 MED ORDER — VALSARTAN-HYDROCHLOROTHIAZIDE 320-25 MG PO TABS: 0.5000 | ORAL_TABLET | Freq: Every day | ORAL | 2 refills | Status: DC

## 2015-11-30 NOTE — Telephone Encounter (Signed)
Pt left VM stating his new BP regimen is working perfectly and would like rx sent to [facility].

## 2015-12-03 ENCOUNTER — Telehealth: Payer: Self-pay | Admitting: Sports Medicine

## 2015-12-03 NOTE — Telephone Encounter (Signed)
Medication was sent on 10/23, it's documented under the medication tab.

## 2015-12-03 NOTE — Telephone Encounter (Signed)
Pt called. Has bp med been sent to [facility] as yet?

## 2015-12-04 MED ORDER — VALSARTAN-HYDROCHLOROTHIAZIDE 320-12.5 MG PO TABS: 1.0000 | ORAL_TABLET | Freq: Every day | ORAL | 3 refills | Status: DC

## 2015-12-04 NOTE — Telephone Encounter (Signed)
Done

## 2015-12-04 NOTE — Telephone Encounter (Signed)
[facility] called stating they do not have that pill scored and would like to know if you will reorder valsartan-hctz 320-12.5 instead. Please assist

## 2015-12-07 ENCOUNTER — Encounter: Payer: Self-pay | Admitting: Emergency Medicine

## 2015-12-07 ENCOUNTER — Emergency Department
Admission: EM | Admit: 2015-12-07 | Discharge: 2015-12-07 | Disposition: A | Discharge status: Home / Self Care | Payer: Medicare HMO | Source: Home / Self Care | Attending: Family Medicine | Admitting: Family Medicine

## 2015-12-07 DIAGNOSIS — S61411A Laceration without foreign body of right hand, initial encounter: Secondary | ICD-10-CM | POA: Diagnosis not present

## 2015-12-07 NOTE — ED Triage Notes (Signed)
Right hand laceration today while washing dishes cut back of hand with the end of a knife.

## 2015-12-07 NOTE — ED Provider Notes (Signed)
CSN: [id number]     Arrival date & time 12/07/15  1200 History   First MD Initiated Contact with Patient 12/07/15 1218     Chief Complaint  Patient presents with  . Extremity Laceration   (Consider location/radiation/quality/duration/timing/severity/associated sxs/prior Treatment) HPI  Michael Conrad is a 70 y.o. male presenting to UC with c/o laceration to the back of his Right hand that occurred just PTA.  Pt notes he was cleaning dishes when he accidentally cut himself with a knife.  Pain is minimal at this time. Bleeding controlled PTA but pt notes it was bleeding a lot initially.  No other injuries. Last tetanus- 5 years ago.  He is not on blood thinners. Pt is Left hand dominant.   Past Medical History:  Diagnosis Date  . Hyperlipidemia   . Hypertension    Past Surgical History:  Procedure Laterality Date  . REVISION TOTAL HIP ARTHROPLASTY    . ROTATOR CUFF REPAIR     Family History  Problem Relation Age of Onset  . Heart disease Mother   . Heart disease Father    Social History  Substance Use Topics  . Smoking status: Never Smoker  . Smokeless tobacco: Never Used  . Alcohol use 3.0 oz/week    5 Standard drinks or equivalent per week    Review of Systems  Musculoskeletal: Negative for arthralgias, joint swelling and myalgias.  Skin: Positive for wound. Negative for color change.  Neurological: Negative for weakness and numbness.    Allergies  Review of patient's allergies indicates no known allergies.  Home Medications   Prior to Admission medications   Medication Sig Start Date End Date Taking? Authorizing Provider  atorvastatin (LIPITOR) 40 MG tablet TAKE 1 TABLET EVERY DAY  AT  6  PM 10/27/15   Silverio Decamp, MD  gabapentin (NEURONTIN) 300 MG capsule One tab PO qHS for a week, then BID for a week, then TID. May double weekly to a max of 3,600mg /day 11/10/15   Silverio Decamp, MD  omeprazole (PRILOSEC) 40 MG capsule Take 1 capsule (40 mg  total) by mouth 2 (two) times daily. 10/08/15   Silverio Decamp, MD  traMADol (ULTRAM) 50 MG tablet 1-2 tabs by mouth Q8 hours, maximum 6 tabs per day. 10/27/15   Silverio Decamp, MD  traZODone (DESYREL) 100 MG tablet Take 1 tablet (100 mg total) by mouth at bedtime. 08/18/15   Silverio Decamp, MD  triamcinolone cream (KENALOG) 0.5 % Apply 1 application topically 2 (two) times daily. To affected areas. 10/22/15   Silverio Decamp, MD  valsartan-hydrochlorothiazide (DIOVAN-HCT) 320-12.5 MG tablet Take 1 tablet by mouth daily. 12/04/15   Silverio Decamp, MD   Meds Ordered and Administered this Visit  Medications - No data to display  BP 141/93 (BP Location: Left Arm)   Pulse 72   Temp 97.6 F (36.4 C) (Oral)   Ht 6' (1.829 m)   Wt 228 lb (103.4 kg)   SpO2 98%   BMI 30.92 kg/m  No data found.   Physical Exam  Constitutional: He is oriented to person, place, and time. He appears well-developed and well-nourished.  HENT:  Head: Normocephalic and atraumatic.  Eyes: EOM are normal.  Neck: Normal range of motion.  Cardiovascular: Normal rate.   Pulmonary/Chest: Effort normal.  Musculoskeletal: Normal range of motion. He exhibits no edema or tenderness.  Right hand: no edema, full ROM.  Neurological: He is alert and oriented to person, place, and time.  Right hand: normal sensation compared to Left.  Skin: Skin is warm and dry. Capillary refill takes less than 2 seconds.  Right hand, dorsal aspect: 2cm superficial laceration. Bleeding controlled. No foreign bodies seen or palpated.  Psychiatric: He has a normal mood and affect. His behavior is normal.  Nursing note and vitals reviewed.   Urgent Care Course   Clinical Course    .Marland KitchenLaceration Repair Date/Time: 12/07/2015 12:35 PM Performed by: Noland Fordyce Authorized by: Theone Murdoch A   Consent:    Consent obtained:  Verbal   Consent given by:  Patient   Risks discussed:  Pain, poor cosmetic  result and poor wound healing   Alternatives discussed:  No treatment, delayed treatment and observation Anesthesia (see MAR for exact dosages):    Anesthesia method:  None Laceration details:    Location:  Hand   Hand location:  R hand, dorsum   Length (cm):  2   Depth (mm):  2 Repair type:    Repair type:  Simple Pre-procedure details:    Preparation:  Patient was prepped and draped in usual sterile fashion Exploration:    Wound exploration: wound explored through full range of motion and entire depth of wound probed and visualized     Wound extent: no areolar tissue violation noted, no fascia violation noted, no foreign bodies/material noted, no muscle damage noted, no nerve damage noted, no tendon damage noted, no underlying fracture noted and no vascular damage noted     Contaminated: no   Treatment:    Area cleansed with:  Hibiclens and saline   Amount of cleaning:  Standard   Visualized foreign bodies/material removed: no   Skin repair:    Repair method:  Steri-Strips   Number of Steri-Strips:  3 Approximation:    Approximation:  Close   Vermilion border: well-aligned   Post-procedure details:    Dressing:  Adhesive bandage   Patient tolerance of procedure:  Tolerated well, no immediate complications   (including critical care time)  Labs Review Labs Reviewed - No data to display  Imaging Review No results found.    MDM   1. Laceration of right hand without foreign body, initial encounter    Superficial laceration to dorsum of Right hand. PMS in tact.  Three steri-strips applied.   Home care instructions provided. F/u with PCP as needed in 1 week if not improving, sooner if signs of infection.    Noland Fordyce, PA-C 12/07/15 [phone]

## 2015-12-09 NOTE — Telephone Encounter (Signed)
[facility] pharmacy called stating medication was sent over as 320-12.5 and not 160-12.5. Would like to know if the dose is correct before sending medication to pt. Please assist.

## 2015-12-10 MED ORDER — VALSARTAN-HYDROCHLOROTHIAZIDE 160-12.5 MG PO TABS
1.0000 | ORAL_TABLET | Freq: Every day | ORAL | 2 refills | Status: DC
Start: 2015-12-10 — End: 2016-11-02

## 2015-12-10 MED ORDER — VALSARTAN-HYDROCHLOROTHIAZIDE 320-12.5 MG PO TABS: 0.5000 | ORAL_TABLET | Freq: Every day | ORAL | 3 refills | Status: DC

## 2015-12-10 NOTE — Telephone Encounter (Signed)
Verified appropriate dose with pharmacy and changed to dose.

## 2015-12-10 NOTE — Telephone Encounter (Signed)
That is precisely what they asked for earlier, and he will cut it in half.

## 2016-03-03 ENCOUNTER — Ambulatory Visit (INDEPENDENT_AMBULATORY_CARE_PROVIDER_SITE_OTHER): Payer: Medicare HMO | Admitting: Sports Medicine

## 2016-03-03 ENCOUNTER — Encounter: Payer: Self-pay | Admitting: Sports Medicine

## 2016-03-03 DIAGNOSIS — I10 Essential (primary) hypertension: Secondary | ICD-10-CM | POA: Diagnosis not present

## 2016-03-03 DIAGNOSIS — Z0000 Encounter for general adult medical examination without abnormal findings: Secondary | ICD-10-CM

## 2016-03-03 DIAGNOSIS — J392 Other diseases of pharynx: Secondary | ICD-10-CM | POA: Diagnosis not present

## 2016-03-03 DIAGNOSIS — G6289 Other specified polyneuropathies: Secondary | ICD-10-CM

## 2016-03-03 MED ORDER — TRAZODONE HCL 100 MG PO TABS
100.0000 mg | ORAL_TABLET | Freq: Every day | ORAL | 3 refills | Status: DC
Start: 2016-03-03 — End: 2016-11-02

## 2016-03-03 MED ORDER — GABAPENTIN 800 MG PO TABS: ORAL_TABLET | ORAL | 3 refills | Status: DC

## 2016-03-03 NOTE — Assessment & Plan Note (Signed)
Well controlled, no changes 

## 2016-03-03 NOTE — Assessment & Plan Note (Signed)
Due for pneumonia 23 and 13 vaccines and shingles vaccines.  He will get these done through the [place].

## 2016-03-03 NOTE — Assessment & Plan Note (Signed)
Doing well on gabapentin, refilling with 800 mg pills

## 2016-03-03 NOTE — Patient Instructions (Signed)
Due for pneumonia 23 and 13 vaccines and shingles vaccines.  Please get these done through the [place].

## 2016-03-03 NOTE — Assessment & Plan Note (Signed)
Biopsy was negative at the [place].

## 2016-03-03 NOTE — Progress Notes (Signed)
  Subjective:    CC: Follow-up  HPI: Peripheral neuropathy: Well controlled with gabapentin  Hypertension: Well controlled  Pharyngeal mass: The biopsy was negative at the [place]  Past medical history:  Negative.  See flowsheet/record as well for more information.  Surgical history: Negative.  See flowsheet/record as well for more information.  Family history: Negative.  See flowsheet/record as well for more information.  Social history: Negative.  See flowsheet/record as well for more information.  Allergies, and medications have been entered into the medical record, reviewed, and no changes needed.   Review of Systems: No fevers, chills, night sweats, weight loss, chest pain, or shortness of breath.   Objective:    General: Well Developed, well nourished, and in no acute distress.  Neuro: Alert and oriented x3, extra-ocular muscles intact, sensation grossly intact.  HEENT: Normocephalic, atraumatic, pupils equal round reactive to light, neck supple, no masses, no lymphadenopathy, thyroid nonpalpable.  Skin: Warm and dry, no rashes. Cardiac: Regular rate and rhythm, no murmurs rubs or gallops, no lower extremity edema.  Respiratory: Clear to auscultation bilaterally. Not using accessory muscles, speaking in full sentences.  Impression and Recommendations:    Annual physical exam Due for pneumonia 23 and 13 vaccines and shingles vaccines.  He will get these done through the [place].  Mass of pharynx Biopsy was negative at the [place].  Peripheral neuropathy ([facility]) Doing well on gabapentin, refilling with 800 mg pills  Essential hypertension, benign Well-controlled, no changes.  I spent 25 minutes with this patient, greater than 50% was face-to-face time counseling regarding the above diagnoses

## 2016-04-01 ENCOUNTER — Telehealth: Payer: Self-pay

## 2016-04-01 MED ORDER — GABAPENTIN 600 MG PO TABS: ORAL_TABLET | ORAL | 3 refills | Status: DC

## 2016-04-01 NOTE — Telephone Encounter (Signed)
I assume he means the gabapentin, I am going to switch it to 1200 mg 3 times per day.

## 2016-04-01 NOTE — Telephone Encounter (Signed)
Pt left VM stating the recent medication increase is still not helping with his pain and would like to know what the next step is. Please advise.

## 2016-04-04 NOTE — Telephone Encounter (Signed)
Pt.notified

## 2016-04-05 ENCOUNTER — Telehealth: Payer: Self-pay

## 2016-04-05 NOTE — Telephone Encounter (Signed)
Pt left VM asking what do you feel about him taking turmeric? Please advise.

## 2016-04-06 NOTE — Telephone Encounter (Signed)
Pt informed. Pt expressed understanding and is agreeable. Adilen Pavelko CMA, RT 

## 2016-04-06 NOTE — Telephone Encounter (Signed)
There is no evidence in a randomized controlled studies that it helps however this doesn't mean that it is not effective, it just means that we do not know whether it's effective or not. Because turmeric is unlikely to hurt him, it is okay to take!

## 2016-05-05 ENCOUNTER — Telehealth: Payer: Self-pay

## 2016-05-05 DIAGNOSIS — J392 Other diseases of pharynx: Secondary | ICD-10-CM

## 2016-05-05 NOTE — Telephone Encounter (Signed)
Pt left VM stating med changes still are not working for the pain in his feet. Would like to know what the next step is as far as testing, labs or specialist. Please advise.

## 2016-05-09 MED ORDER — ALPHA-LIPOIC ACID 600 MG PO CAPS: 1.0000 | ORAL_CAPSULE | Freq: Every day | ORAL | 11 refills | Status: DC

## 2016-05-09 NOTE — Telephone Encounter (Signed)
Starting alpha lipoic acid 600mg  daily

## 2016-05-12 ENCOUNTER — Telehealth: Payer: Self-pay

## 2016-05-12 DIAGNOSIS — R52 Pain, unspecified: Secondary | ICD-10-CM

## 2016-05-12 NOTE — Telephone Encounter (Signed)
Pt called and said that the Alpha-Lipoic acid is in 200 mg caps.  Is he to take 3 each day?  Is he to continue all of his other meds?  Please advise.

## 2016-05-12 NOTE — Telephone Encounter (Signed)
Yes, if he can only find 200 mg then yes, take 3 of them.

## 2016-05-12 NOTE — Telephone Encounter (Signed)
Pt called clinic to get an update on the phone note that was placed earlier in the week. Pt advised of new Rx and that it was sent to local pharmacy. Pt typically gets everything though mail order so I advised him to try the new Rx for 2 weeks and if he likes it to call back and we will send 90 day supply to mail order. Pt verbalized understanding.  Pt was very upset that there was no status update after he left his initial voicemail. Pt stated "it shouldn't be my responsibility to continue calling to get an update." I apologized for this inconvenience. Pt was very receptive.  Pt also states he is "very happy with his care from Dr. Ames Dura and trust this new Rx will help." No further questions/concerns.

## 2016-05-12 NOTE — Telephone Encounter (Signed)
Dr. Ames Dura, [place]?  Shouldn't it be Dr. Diego Cory?    Sounds like everything is taken care of.

## 2016-05-13 NOTE — Telephone Encounter (Signed)
He felt that a neurologist would be helpful.  Do you want me to put in a referral?

## 2016-05-13 NOTE — Telephone Encounter (Signed)
Yes please, [facility]

## 2016-05-13 NOTE — Telephone Encounter (Signed)
Notified patient,  He is asking if he should see a foot specialist?  He over did it yesterday and his feet bothering him more.

## 2016-05-13 NOTE — Telephone Encounter (Signed)
With peripheral neuropathy a podiatrist is not going to be that useful. Neurology is though.

## 2016-05-17 ENCOUNTER — Telehealth: Payer: Self-pay

## 2016-05-17 NOTE — Telephone Encounter (Signed)
OK fine he can just cancel any appointments he has.

## 2016-05-17 NOTE — Telephone Encounter (Signed)
Patient would like to cancel the referral to Neurology. He states he will "change his life style".

## 2016-05-18 NOTE — Telephone Encounter (Signed)
Patient advised and referral cancelled.

## 2016-07-25 ENCOUNTER — Encounter: Payer: Self-pay | Admitting: Sports Medicine

## 2016-07-25 ENCOUNTER — Ambulatory Visit (INDEPENDENT_AMBULATORY_CARE_PROVIDER_SITE_OTHER): Payer: Medicare HMO | Admitting: Sports Medicine

## 2016-07-25 DIAGNOSIS — I10 Essential (primary) hypertension: Secondary | ICD-10-CM

## 2016-07-25 DIAGNOSIS — Z0000 Encounter for general adult medical examination without abnormal findings: Secondary | ICD-10-CM

## 2016-07-25 DIAGNOSIS — R079 Chest pain, unspecified: Secondary | ICD-10-CM

## 2016-07-25 LAB — CBC
HCT: 44.4 % (ref 38.5–50.0)
Hemoglobin: 14.7 g/dL (ref 13.2–17.1)
MCH: 29.8 pg (ref 27.0–33.0)
MCHC: 33.1 g/dL (ref 32.0–36.0)
MCV: 90.1 fL (ref 80.0–100.0)
MPV: 9.5 fL (ref 7.5–12.5)
Platelets: 295 10*3/uL (ref 140–400)
RBC: 4.93 MIL/uL (ref 4.20–5.80)
RDW: 13.1 % (ref 11.0–15.0)
WBC: 7.2 K/uL (ref 3.8–10.8)

## 2016-07-25 NOTE — Assessment & Plan Note (Signed)
He did have a single episode of low blood pressure in the 91Y systolic, tells me he did have some chest discomfort and left arm tingling during this episode. He was mostly at rest during this episode raising the concern for unstable angina. I have recommended urgent cardiology visit with catheterization or chemical stress test however he declines. Patient was made aware of the risks. He understands and would still like to simply watch this for now.  I'm going to check some blood work including cardiac enzymes. He will call me ASAP if develops any new symptoms.

## 2016-07-25 NOTE — Assessment & Plan Note (Signed)
Well controlled on current blood pressure medication, he did have a single episode of low blood pressure in the 63J systolic, tells me he did have some chest discomfort and left arm tingling during this episode. He was mostly at rest during this episode raising the concern for unstable angina. I have recommended urgent cardiology visit with catheterization or chemical stress test however he declines. Patient was made aware of the risks. He understands and like to simply watch this for now.  I'm going to check some blood work including cardiac enzymes.

## 2016-07-25 NOTE — Assessment & Plan Note (Signed)
Hep C screening.

## 2016-07-25 NOTE — Progress Notes (Signed)
  Subjective:    CC: Blood pressure concerns  HPI: This is a pleasant 71 year old male, symptoms and his numbers have been well-controlled, more recently since the weather is warm that he's noted awaiting and labile blood pressure readings, generally they run completely normal from 520 to 802 systolic however he's had one reading in the 233K systolic during which she was asymptomatic, he had another around 70 systolic and tells me he felt chest pain, left arm tingling. I explained my concerns but he declines any further cardiac workup including catheterization or chemical stress test like he's had for about 5 years ago. He agrees to do some blood work today.  Past medical history:  Negative.  See flowsheet/record as well for more information.  Surgical history: Negative.  See flowsheet/record as well for more information.  Family history: Negative.  See flowsheet/record as well for more information.  Social history: Negative.  See flowsheet/record as well for more information.  Allergies, and medications have been entered into the medical record, reviewed, and no changes needed.   Review of Systems: No fevers, chills, night sweats, weight loss, chest pain, or shortness of breath.   Objective:    General: Well Developed, well nourished, and in no acute distress.  Neuro: Alert and oriented x3, extra-ocular muscles intact, sensation grossly intact.  HEENT: Normocephalic, atraumatic, pupils equal round reactive to light, neck supple, no masses, no lymphadenopathy, thyroid nonpalpable.  Skin: Warm and dry, no rashes. Cardiac: Regular rate and rhythm, no murmurs rubs or gallops, no lower extremity edema.  Respiratory: Clear to auscultation bilaterally. Not using accessory muscles, speaking in full sentences.  Impression and Recommendations:    Essential hypertension, benign Well controlled on current blood pressure medication, he did have a single episode of low blood pressure in the 12A  systolic, tells me he did have some chest discomfort and left arm tingling during this episode. He was mostly at rest during this episode raising the concern for unstable angina. I have recommended urgent cardiology visit with catheterization or chemical stress test however he declines. Patient was made aware of the risks. He understands and like to simply watch this for now.  I'm going to check some blood work including cardiac enzymes.  Annual physical exam Hep C screening.  Chest pain He did have a single episode of low blood pressure in the 44L systolic, tells me he did have some chest discomfort and left arm tingling during this episode. He was mostly at rest during this episode raising the concern for unstable angina. I have recommended urgent cardiology visit with catheterization or chemical stress test however he declines. Patient was made aware of the risks. He understands and would still like to simply watch this for now.  I'm going to check some blood work including cardiac enzymes. He will call me ASAP if develops any new symptoms.

## 2016-07-26 LAB — CK TOTAL AND CKMB (NOT AT ARMC)
CK, MB: <0.7 ng/mL (ref 0.0–5.0)
Total CK: 50 U/L (ref 44–196)

## 2016-07-26 LAB — COMPREHENSIVE METABOLIC PANEL WITH GFR
ALT: 29 U/L (ref 9–46)
AST: 21 U/L (ref 10–35)
Alkaline Phosphatase: 68 U/L (ref 40–115)
BUN: 16 mg/dL (ref 7–25)
Glucose, Bld: 78 mg/dL (ref 65–99)
Total Bilirubin: 0.5 mg/dL (ref 0.2–1.2)
Total Protein: 6.6 g/dL (ref 6.1–8.1)

## 2016-07-26 LAB — LIPID PANEL W/REFLEX DIRECT LDL
Cholesterol: 151 mg/dL (ref <200)
HDL: 36 mg/dL — ABNORMAL LOW (ref >40)
LDL-Cholesterol: 82 mg/dL
Non-HDL Cholesterol (Calc): 115 mg/dL (ref <130)
Total CHOL/HDL Ratio: 4.2 Ratio (ref <5.0)
Triglycerides: 252 mg/dL — ABNORMAL HIGH (ref <150)

## 2016-07-26 LAB — COMPREHENSIVE METABOLIC PANEL
Albumin: 4.5 g/dL (ref 3.6–5.1)
CO2: 22 mmol/L (ref 20–31)
Calcium: 9.3 mg/dL (ref 8.6–10.3)
Chloride: 102 mmol/L (ref 98–110)
Creat: 0.99 mg/dL (ref 0.70–1.18)
Potassium: 4.2 mmol/L (ref 3.5–5.3)
Sodium: 138 mmol/L (ref 135–146)

## 2016-07-26 LAB — HEMOGLOBIN A1C
Hgb A1c MFr Bld: 5.9 % — ABNORMAL HIGH (ref <5.7)
Mean Plasma Glucose: 123 mg/dL

## 2016-07-26 LAB — TSH: TSH: 1.22 mIU/L (ref 0.40–4.50)

## 2016-07-26 LAB — HEPATITIS C ANTIBODY: HCV Ab: NEGATIVE

## 2016-07-28 LAB — TROPONIN T: Troponin T TROPT: <0.01 ng/mL (ref <0.01)

## 2016-10-25 ENCOUNTER — Ambulatory Visit: Payer: Medicare HMO | Admitting: Sports Medicine

## 2016-11-02 ENCOUNTER — Ambulatory Visit (INDEPENDENT_AMBULATORY_CARE_PROVIDER_SITE_OTHER): Payer: Medicare HMO | Admitting: Sports Medicine

## 2016-11-02 ENCOUNTER — Encounter: Payer: Self-pay | Admitting: Sports Medicine

## 2016-11-02 VITALS — BP 124/84 | HR 88 | Resp 18 | Wt 222.0 lb

## 2016-11-02 DIAGNOSIS — Z0000 Encounter for general adult medical examination without abnormal findings: Secondary | ICD-10-CM

## 2016-11-02 DIAGNOSIS — I10 Essential (primary) hypertension: Secondary | ICD-10-CM | POA: Diagnosis not present

## 2016-11-02 DIAGNOSIS — G6289 Other specified polyneuropathies: Secondary | ICD-10-CM

## 2016-11-02 DIAGNOSIS — M1611 Unilateral primary osteoarthritis, right hip: Secondary | ICD-10-CM | POA: Diagnosis not present

## 2016-11-02 DIAGNOSIS — Z23 Encounter for immunization: Secondary | ICD-10-CM

## 2016-11-02 MED ORDER — IRBESARTAN-HYDROCHLOROTHIAZIDE 150-12.5 MG PO TABS: 1.0000 | ORAL_TABLET | Freq: Every day | ORAL | 3 refills | Status: DC

## 2016-11-02 MED ORDER — TRAZODONE HCL 100 MG PO TABS
100.0000 mg | ORAL_TABLET | Freq: Every day | ORAL | 3 refills | Status: DC
Start: 2016-11-02 — End: 2017-04-19

## 2016-11-02 MED ORDER — TRAMADOL HCL 50 MG PO TABS: ORAL_TABLET | ORAL | 0 refills | Status: DC

## 2016-11-02 NOTE — Assessment & Plan Note (Signed)
Influenza and pneumonia 13 vaccines given today. He will need the pneumonia 23 in a year.

## 2016-11-02 NOTE — Progress Notes (Signed)
  Subjective:    CC: follow-up  HPI: Hypertension: Well controlled but on valsartan so we have change it.  Past medical history:  Negative.  See flowsheet/record as well for more information.  Surgical history: Negative.  See flowsheet/record as well for more information.  Family history: Negative.  See flowsheet/record as well for more information.  Social history: Negative.  See flowsheet/record as well for more information.  Allergies, and medications have been entered into the medical record, reviewed, and no changes needed.   Review of Systems: No fevers, chills, night sweats, weight loss, chest pain, or shortness of breath.   Objective:    General: Well Developed, well nourished, and in no acute distress.  Neuro: Alert and oriented x3, extra-ocular muscles intact, sensation grossly intact.  HEENT: Normocephalic, atraumatic, pupils equal round reactive to light, neck supple, no masses, no lymphadenopathy, thyroid nonpalpable.  Skin: Warm and dry, no rashes. Cardiac: Regular rate and rhythm, no murmurs rubs or gallops, no lower extremity edema.  Respiratory: Clear to auscultation bilaterally. Not using accessory muscles, speaking in full sentences.  Impression and Recommendations:    Essential hypertension, benign Need to switch to irbesartan from valsartan due to recall. He will check his blood pressures at home and let me know.  Annual physical exam Influenza and pneumonia 13 vaccines given today. He will need the pneumonia 23 in a year.  I spent 25 minutes with this patient, greater than 50% was face-to-face time counseling regarding the above diagnoses ___________________________________________ Gwen Her. Dianah Field, M.D., ABFM., CAQSM. Primary Care and [place] Instructor of [place] of [facility] of Medicine

## 2016-11-02 NOTE — Assessment & Plan Note (Signed)
Need to switch to irbesartan from valsartan due to recall. He will check his blood pressures at home and let me know.

## 2016-11-16 ENCOUNTER — Other Ambulatory Visit: Payer: Self-pay

## 2016-11-16 DIAGNOSIS — I10 Essential (primary) hypertension: Secondary | ICD-10-CM

## 2016-11-16 MED ORDER — IRBESARTAN-HYDROCHLOROTHIAZIDE 150-12.5 MG PO TABS: 1.0000 | ORAL_TABLET | Freq: Every day | ORAL | 1 refills | Status: DC

## 2017-01-05 ENCOUNTER — Other Ambulatory Visit: Payer: Self-pay | Admitting: Sports Medicine

## 2017-03-22 ENCOUNTER — Ambulatory Visit (INDEPENDENT_AMBULATORY_CARE_PROVIDER_SITE_OTHER): Payer: Medicare HMO | Admitting: Sports Medicine

## 2017-03-22 DIAGNOSIS — C61 Malignant neoplasm of prostate: Secondary | ICD-10-CM

## 2017-03-22 DIAGNOSIS — M1812 Unilateral primary osteoarthritis of first carpometacarpal joint, left hand: Secondary | ICD-10-CM

## 2017-03-22 DIAGNOSIS — G6289 Other specified polyneuropathies: Secondary | ICD-10-CM | POA: Diagnosis not present

## 2017-03-22 MED ORDER — TAMSULOSIN HCL 0.4 MG PO CAPS: 0.4000 mg | ORAL_CAPSULE | Freq: Every day | ORAL | 3 refills | Status: DC

## 2017-03-22 MED ORDER — TRAMADOL-ACETAMINOPHEN 37.5-325 MG PO TABS: 1.0000 | ORAL_TABLET | Freq: Three times a day (TID) | ORAL | 0 refills | Status: DC | PRN

## 2017-03-22 MED ORDER — ALPHA-LIPOIC ACID 600 MG PO CAPS: 1.0000 | ORAL_CAPSULE | Freq: Every day | ORAL | 11 refills | Status: DC

## 2017-03-22 NOTE — Assessment & Plan Note (Signed)
Insufficient relief with gabapentin, oxcarbazepine prescribed by another provider. Switching to alpha lipoic acid. Return in a month to discuss this again. Certainly we could try SNRIs as well.

## 2017-03-22 NOTE — Assessment & Plan Note (Signed)
Switching from tramadol to Ultracet. If insufficient relief we will do an injection.

## 2017-03-22 NOTE — Progress Notes (Signed)
Subjective:    CC: Follow-up  HPI: Left thumb pain: Present for years, base of the thumb, gelling, worse with gripping objects, no radiation.  Not well controlled with current tramadol dose.  Weak urinary stream: History of prostate cancer, recently slowly rising PSA, increasing frequency, urgency, nocturia and dribbling.  Has not seen a urologist in some time now.  No burning, no flank pain.  Peripheral neuropathy: At this point is not been well controlled with gabapentin, he was then put on oxcarbazepine, also not well controlled.  We have not yet tried alpha lipoic acid or serotonin norepinephrine reuptake inhibitors.  I reviewed the past medical history, family history, social history, surgical history, and allergies today and no changes were needed.  Please see the problem list section below in epic for further details.  Past Medical History: Past Medical History:  Diagnosis Date  . Hyperlipidemia   . Hypertension    Past Surgical History: Past Surgical History:  Procedure Laterality Date  . REVISION TOTAL HIP ARTHROPLASTY    . ROTATOR CUFF REPAIR     Social History: Social History   Socioeconomic History  . Marital status: Divorced    Spouse name: Not on file  . Number of children: Not on file  . Years of education: Not on file  . Highest education level: Not on file  Social Needs  . Financial resource strain: Not on file  . Food insecurity - worry: Not on file  . Food insecurity - inability: Not on file  . Transportation needs - medical: Not on file  . Transportation needs - non-medical: Not on file  Occupational History  . Not on file  Tobacco Use  . Smoking status: Never Smoker  . Smokeless tobacco: Never Used  Substance and Sexual Activity  . Alcohol use: Yes    Alcohol/week: 3.0 oz    Types: 5 Standard drinks or equivalent per week  . Drug use: No  . Sexual activity: Not Currently  Other Topics Concern  . Not on file  Social History Narrative  . Not  on file   Family History: Family History  Problem Relation Age of Onset  . Heart disease Mother   . Heart disease Father    Allergies: No Known Allergies Medications: See med rec.  Review of Systems: No fevers, chills, night sweats, weight loss, chest pain, or shortness of breath.   Objective:    General: Well Developed, well nourished, and in no acute distress.  Neuro: Alert and oriented x3, extra-ocular muscles intact, sensation grossly intact.  HEENT: Normocephalic, atraumatic, pupils equal round reactive to light, neck supple, no masses, no lymphadenopathy, thyroid nonpalpable.  Skin: Warm and dry, no rashes. Cardiac: Regular rate and rhythm, no murmurs rubs or gallops, no lower extremity edema.  Respiratory: Clear to auscultation bilaterally. Not using accessory muscles, speaking in full sentences. Left wrist: Inspection normal with no visible erythema or swelling. ROM smooth and normal with good flexion and extension and ulnar/radial deviation that is symmetrical with opposite wrist. Tender to palpation at the trapeziometacarpal joint No snuffbox tenderness. No tenderness over [place]. Strength 5/5 in all directions without pain. Negative tinel's and phalens signs. Negative Finkelstein sign. Negative Watson's test.  Impression and Recommendations:    Prostate cancer PSA in April 2016 was 5.4, and 5.58, now over 6. Referral to urology. He does have some obstructive uropathy as well, adding Flomax.  Primary osteoarthritis of first carpometacarpal joint of left hand Switching from tramadol to Ultracet. If  insufficient relief we will do an injection.  Peripheral neuropathy (HCC) Insufficient relief with gabapentin, oxcarbazepine prescribed by another provider. Switching to alpha lipoic acid. Return in a month to discuss this again. Certainly we could try SNRIs as well. ___________________________________________ Gwen Her. Dianah Field, M.D., ABFM.,  CAQSM. Primary Care and [place] Instructor of [place] of [facility] of Medicine

## 2017-03-22 NOTE — Assessment & Plan Note (Signed)
PSA in April 2016 was 5.4, and 5.58, now over 6. Referral to urology. He does have some obstructive uropathy as well, adding Flomax.

## 2017-04-04 ENCOUNTER — Other Ambulatory Visit: Payer: Self-pay | Admitting: Sports Medicine

## 2017-04-12 ENCOUNTER — Telehealth: Payer: Self-pay

## 2017-04-12 DIAGNOSIS — Z96642 Presence of left artificial hip joint: Secondary | ICD-10-CM

## 2017-04-12 DIAGNOSIS — M1611 Unilateral primary osteoarthritis, right hip: Secondary | ICD-10-CM

## 2017-04-12 NOTE — Telephone Encounter (Signed)
Pt left VM asking if he can have x-rays done of his right hip and lower back. Has a f/u appointment with you next week.

## 2017-04-13 NOTE — Telephone Encounter (Signed)
Orders placed.

## 2017-04-18 ENCOUNTER — Ambulatory Visit (INDEPENDENT_AMBULATORY_CARE_PROVIDER_SITE_OTHER): Payer: Medicare HMO

## 2017-04-18 DIAGNOSIS — M545 Low back pain: Secondary | ICD-10-CM | POA: Diagnosis not present

## 2017-04-18 DIAGNOSIS — M5136 Other intervertebral disc degeneration, lumbar region: Secondary | ICD-10-CM

## 2017-04-18 DIAGNOSIS — M1611 Unilateral primary osteoarthritis, right hip: Secondary | ICD-10-CM

## 2017-04-19 ENCOUNTER — Ambulatory Visit (INDEPENDENT_AMBULATORY_CARE_PROVIDER_SITE_OTHER): Payer: Medicare HMO | Admitting: Sports Medicine

## 2017-04-19 ENCOUNTER — Encounter: Payer: Self-pay | Admitting: Sports Medicine

## 2017-04-19 DIAGNOSIS — G6289 Other specified polyneuropathies: Secondary | ICD-10-CM

## 2017-04-19 DIAGNOSIS — M1611 Unilateral primary osteoarthritis, right hip: Secondary | ICD-10-CM

## 2017-04-19 DIAGNOSIS — M5136 Other intervertebral disc degeneration, lumbar region: Secondary | ICD-10-CM | POA: Diagnosis not present

## 2017-04-19 DIAGNOSIS — M1812 Unilateral primary osteoarthritis of first carpometacarpal joint, left hand: Secondary | ICD-10-CM

## 2017-04-19 DIAGNOSIS — M51369 Other intervertebral disc degeneration, lumbar region without mention of lumbar back pain or lower extremity pain: Secondary | ICD-10-CM | POA: Insufficient documentation

## 2017-04-19 DIAGNOSIS — C61 Malignant neoplasm of prostate: Secondary | ICD-10-CM

## 2017-04-19 MED ORDER — TRAZODONE HCL 100 MG PO TABS: 100.0000 mg | ORAL_TABLET | Freq: Every day | ORAL | 3 refills | Status: DC

## 2017-04-19 MED ORDER — TRAMADOL-ACETAMINOPHEN 37.5-325 MG PO TABS: 1.0000 | ORAL_TABLET | Freq: Three times a day (TID) | ORAL | 0 refills | Status: DC | PRN

## 2017-04-19 MED ORDER — TAMSULOSIN HCL 0.4 MG PO CAPS: 0.4000 mg | ORAL_CAPSULE | Freq: Every day | ORAL | 0 refills | Status: DC

## 2017-04-19 MED ORDER — TRAZODONE HCL 100 MG PO TABS: 100.0000 mg | ORAL_TABLET | Freq: Every day | ORAL | 0 refills | Status: DC

## 2017-04-19 MED ORDER — TAMSULOSIN HCL 0.4 MG PO CAPS: 0.4000 mg | ORAL_CAPSULE | Freq: Every day | ORAL | 3 refills | Status: DC

## 2017-04-19 NOTE — Assessment & Plan Note (Signed)
Well-controlled now with oxcarbazepine, alpha lipoic acid, no further intervention needed.

## 2017-04-19 NOTE — Assessment & Plan Note (Signed)
Good response to osteopathic manipulation with Dr. Emeterio Reeve back in 2016, he would like to do this again.

## 2017-04-19 NOTE — Assessment & Plan Note (Signed)
Essentially pain-free with Ultracet, alpha lipoic acid. We can always do an injection if he needs, he is post left hip arthroplasty.

## 2017-04-19 NOTE — Assessment & Plan Note (Signed)
Slowly rising PSA, 5.4 in April 2016, then up to 5.58, now over 6. He does have an appointment with urology/oncology. He was also having some obstructive symptoms so I added Flomax which has worked very well.

## 2017-04-19 NOTE — Assessment & Plan Note (Signed)
Doing well with Ultracet and alpha lipoic acid, no injection needed.

## 2017-04-19 NOTE — Progress Notes (Signed)
  Subjective:    CC: Follow-up  HPI: Peripheral neuropathy: Well-controlled with oxcarbazepine and alpha lipoic acid.  Right hip osteoarthritis: Seemingly resolved now, only minimal pain.  Thumb basal joint arthritis: Doing much better.  Obstructive uropathy: Resolved with Flomax, he does have an appointment with oncology and urology for his rising PSA.  I reviewed the past medical history, family history, social history, surgical history, and allergies today and no changes were needed.  Please see the problem list section below in epic for further details.  Past Medical History: Past Medical History:  Diagnosis Date  . Hyperlipidemia   . Hypertension    Past Surgical History: Past Surgical History:  Procedure Laterality Date  . REVISION TOTAL HIP ARTHROPLASTY    . ROTATOR CUFF REPAIR     Social History: Social History   Socioeconomic History  . Marital status: Divorced    Spouse name: None  . Number of children: None  . Years of education: None  . Highest education level: None  Social Needs  . Financial resource strain: None  . Food insecurity - worry: None  . Food insecurity - inability: None  . Transportation needs - medical: None  . Transportation needs - non-medical: None  Occupational History  . None  Tobacco Use  . Smoking status: Never Smoker  . Smokeless tobacco: Never Used  Substance and Sexual Activity  . Alcohol use: Yes    Alcohol/week: 3.0 oz    Types: 5 Standard drinks or equivalent per week  . Drug use: No  . Sexual activity: Not Currently  Other Topics Concern  . None  Social History Narrative  . None   Family History: Family History  Problem Relation Age of Onset  . Heart disease Mother   . Heart disease Father    Allergies: No Known Allergies Medications: See med rec.  Review of Systems: No fevers, chills, night sweats, weight loss, chest pain, or shortness of breath.   Objective:    General: Well Developed, well nourished,  and in no acute distress.  Neuro: Alert and oriented x3, extra-ocular muscles intact, sensation grossly intact.  HEENT: Normocephalic, atraumatic, pupils equal round reactive to light, neck supple, no masses, no lymphadenopathy, thyroid nonpalpable.  Skin: Warm and dry, no rashes. Cardiac: Regular rate and rhythm, no murmurs rubs or gallops, no lower extremity edema.  Respiratory: Clear to auscultation bilaterally. Not using accessory muscles, speaking in full sentences.  Impression and Recommendations:    Primary osteoarthritis of right hip Essentially pain-free with Ultracet, alpha lipoic acid. We can always do an injection if he needs, he is post left hip arthroplasty.  Primary osteoarthritis of first carpometacarpal joint of left hand Doing well with Ultracet and alpha lipoic acid, no injection needed.  Peripheral neuropathy (HCC) Well-controlled now with oxcarbazepine, alpha lipoic acid, no further intervention needed.  Lumbar degenerative disc disease Good response to osteopathic manipulation with Dr. Emeterio Reeve back in 2016, he would like to do this again.  Prostate cancer Slowly rising PSA, 5.4 in April 2016, then up to 5.58, now over 6. He does have an appointment with urology/oncology. He was also having some obstructive symptoms so I added Flomax which has worked very well. ___________________________________________ Gwen Her. Dianah Field, M.D., ABFM., CAQSM. Primary Care and [place] Instructor of [place] of [facility] of Medicine

## 2017-04-24 ENCOUNTER — Encounter: Payer: Self-pay | Admitting: Osteopathic Medicine

## 2017-04-24 ENCOUNTER — Ambulatory Visit (INDEPENDENT_AMBULATORY_CARE_PROVIDER_SITE_OTHER): Payer: Medicare HMO | Admitting: Osteopathic Medicine

## 2017-04-24 VITALS — BP 118/77 | HR 90 | Temp 98.2°F | Wt 221.0 lb

## 2017-04-24 DIAGNOSIS — M9903 Segmental and somatic dysfunction of lumbar region: Secondary | ICD-10-CM | POA: Diagnosis not present

## 2017-04-24 NOTE — Progress Notes (Signed)
HPI: Michael Conrad is a 72 y.o. male who  has a past medical history of Hyperlipidemia and Hypertension.  he presents to [facility] today, 04/24/17, for consultation at the request of Dr. Dianah Field, Gwen Her, MD, for chief complaint of:  Low back pain   Seen few years ago for neck pain concerns w/ good response. Would like to discuss treatments for LBP w/ degenerative disc disease.   No major recent injury. Hurts him on the L lower back QL area w/ rotation to the right and with flexion forward.   Reviewed Dr Mcneil Sober note from  04/19/17  Peripheral neuropathy well controlled w/ oxcarbazepine and alpha lipoic acid.      Past medical history, surgical history, social history and family history reviewed. No updates needed.   Current medication list and allergy/intolerance information reviewed.    Current Outpatient Medications on File Prior to Visit  Medication Sig Dispense Refill  . Alpha-Lipoic Acid 600 MG CAPS Take 1 capsule (600 mg total) by mouth daily. 30 capsule 11  . atorvastatin (LIPITOR) 40 MG tablet TAKE 1 TABLET EVERY DAY AT 6 PM 90 tablet 3  . irbesartan-hydrochlorothiazide (AVALIDE) 150-12.5 MG tablet Take 1 tablet by mouth daily. 90 tablet 1  . omeprazole (PRILOSEC) 40 MG capsule TAKE 1 CAPSULE TWICE DAILY 180 capsule 3  . OXcarbazepine (TRILEPTAL) 150 MG tablet Take 150 mg by mouth 2 (two) times daily.    . tamsulosin (FLOMAX) 0.4 MG CAPS capsule Take 1 capsule (0.4 mg total) by mouth daily. 14 capsule 0  . traMADol-acetaminophen (ULTRACET) 37.5-325 MG tablet Take 1-2 tablets by mouth every 8 (eight) hours as needed for severe pain. 48 tablet 0  . traZODone (DESYREL) 100 MG tablet Take 1 tablet (100 mg total) by mouth at bedtime. 14 tablet 0   No current facility-administered medications on file prior to visit.    No Known Allergies    Review of Systems:  Constitutional: No recent illness  HEENT: No  headache, no vision  change  Cardiac: No  chest pain, No  pressure, No palpitations  Respiratory:  No  shortness of breath. No  Cough  Gastrointestinal: No  abdominal pain, no change on bowel habits  Musculoskeletal: No new myalgia/arthralgia  Skin: No  Rash  Hem/Onc: No  easy bruising/bleeding, No  abnormal lumps/bumps  Neurologic: No  weakness, No  Dizziness  Psychiatric: No  concerns with depression, No  concerns with anxiety  Exam:  BP 118/77 (BP Location: Left Arm)   Pulse 90   Temp 98.2 F (36.8 C) (Oral)   Wt 221 lb 0.6 oz (100.3 kg)   BMI 29.98 kg/m   Constitutional: VS see above. General Appearance: alert, well-developed, well-nourished, NAD  Eyes: Normal lids and conjunctive, non-icteric sclera  Ears, Nose, Mouth, Throat: MMM, Normal external inspection ears/nares/mouth/lips/gums.  Neck: No masses, trachea midline.   Respiratory: Normal respiratory effort.   Musculoskeletal: Gait normal. Symmetric and independent movement of all extremities  (+)reproduction of QL muscle tenderness on rotation to R  L4-5/S1 rotated L w/ PTP palpable  Neurological: Normal balance/coordination. No tremor.  Skin: warm, dry, intact.   Psychiatric: Normal judgment/insight. Normal mood and affect. Oriented x3.     ASSESSMENT/PLAN:   Somatic dysfunction of spine, lumbar - (+)MFR and FPR to good relief of pain, attepmted HVLA without much success      Follow-up plan: Return for repeat treatments as needed .  Visit summary with medication list and pertinent instructions  was printed for patient to review, alert [place] if any changes needed. All questions at time of visit were answered - patient instructed to contact office with any additional concerns. ER/RTC precautions were reviewed with the patient and understanding verbalized.   Note: Total time spent 30 minutes, greater than 50% of the visit was spent face-to-face counseling and coordinating care for the following: The encounter diagnosis was  Somatic dysfunction of spine, lumbar..  Please note: voice recognition software was used to produce this document, and typos may escape review. Please contact Dr. Sheppard Coil for any needed clarifications.

## 2017-05-30 ENCOUNTER — Other Ambulatory Visit: Payer: Self-pay | Admitting: Sports Medicine

## 2017-05-30 DIAGNOSIS — I10 Essential (primary) hypertension: Secondary | ICD-10-CM

## 2017-06-22 ENCOUNTER — Telehealth: Payer: Self-pay

## 2017-06-22 DIAGNOSIS — M1812 Unilateral primary osteoarthritis of first carpometacarpal joint, left hand: Secondary | ICD-10-CM

## 2017-06-22 MED ORDER — TRAMADOL-ACETAMINOPHEN 37.5-325 MG PO TABS: 1.0000 | ORAL_TABLET | Freq: Three times a day (TID) | ORAL | 0 refills | Status: DC | PRN

## 2017-06-22 NOTE — Telephone Encounter (Signed)
Pt left msg stating that Ultracet he was given really helped and wants to know if 90 day supply can be sent to his [place] written 04-19-17 for #[phone] with no RF Next OV scheduled for 10-20-17   Please review and send if appropriate

## 2017-06-22 NOTE — Telephone Encounter (Signed)
Yes, no prob.

## 2017-06-23 NOTE — Telephone Encounter (Signed)
Pt advised that this was sent to Mail Order. No further needs a this time

## 2017-10-20 ENCOUNTER — Encounter: Payer: Self-pay | Admitting: Sports Medicine

## 2017-10-20 ENCOUNTER — Ambulatory Visit (INDEPENDENT_AMBULATORY_CARE_PROVIDER_SITE_OTHER): Payer: Medicare HMO | Admitting: Sports Medicine

## 2017-10-20 VITALS — BP 122/82 | HR 94 | Ht 72.0 in | Wt 216.0 lb

## 2017-10-20 DIAGNOSIS — C61 Malignant neoplasm of prostate: Secondary | ICD-10-CM

## 2017-10-20 DIAGNOSIS — Z23 Encounter for immunization: Secondary | ICD-10-CM | POA: Diagnosis not present

## 2017-10-20 DIAGNOSIS — Z0000 Encounter for general adult medical examination without abnormal findings: Secondary | ICD-10-CM | POA: Diagnosis not present

## 2017-10-20 NOTE — Assessment & Plan Note (Signed)
Recently had blood work checked at the [place]. Flu shot today, return as needed.

## 2017-10-20 NOTE — Assessment & Plan Note (Signed)
Has a low-grade prostate cancer, currently followed by urology.

## 2017-10-20 NOTE — Progress Notes (Signed)
  Subjective:    CC: Follow-up  HPI: Hypertension: Well-controlled.  I reviewed the past medical history, family history, social history, surgical history, and allergies today and no changes were needed.  Please see the problem list section below in epic for further details.  Past Medical History: Past Medical History:  Diagnosis Date  . Hyperlipidemia   . Hypertension    Past Surgical History: Past Surgical History:  Procedure Laterality Date  . REVISION TOTAL HIP ARTHROPLASTY    . ROTATOR CUFF REPAIR     Social History: Social History   Socioeconomic History  . Marital status: Divorced    Spouse name: Not on file  . Number of children: Not on file  . Years of education: Not on file  . Highest education level: Not on file  Occupational History  . Not on file  Social Needs  . Financial resource strain: Not on file  . Food insecurity:    Worry: Not on file    Inability: Not on file  . Transportation needs:    Medical: Not on file    Non-medical: Not on file  Tobacco Use  . Smoking status: Never Smoker  . Smokeless tobacco: Never Used  Substance and Sexual Activity  . Alcohol use: Yes    Alcohol/week: 5.0 standard drinks    Types: 5 Standard drinks or equivalent per week  . Drug use: No  . Sexual activity: Not Currently  Lifestyle  . Physical activity:    Days per week: Not on file    Minutes per session: Not on file  . Stress: Not on file  Relationships  . Social connections:    Talks on phone: Not on file    Gets together: Not on file    Attends religious service: Not on file    Active member of club or organization: Not on file    Attends meetings of clubs or organizations: Not on file    Relationship status: Not on file  Other Topics Concern  . Not on file  Social History Narrative  . Not on file   Family History: Family History  Problem Relation Age of Onset  . Heart disease Mother   . Heart disease Father    Allergies: No Known  Allergies Medications: See med rec.  Review of Systems: No fevers, chills, night sweats, weight loss, chest pain, or shortness of breath.   Objective:    General: Well Developed, well nourished, and in no acute distress.  Neuro: Alert and oriented x3, extra-ocular muscles intact, sensation grossly intact.  HEENT: Normocephalic, atraumatic, pupils equal round reactive to light, neck supple, no masses, no lymphadenopathy, thyroid nonpalpable.  Skin: Warm and dry, no rashes. Cardiac: Regular rate and rhythm, no murmurs rubs or gallops, no lower extremity edema.  Respiratory: Clear to auscultation bilaterally. Not using accessory muscles, speaking in full sentences.  Impression and Recommendations:    Annual physical exam Recently had blood work checked at the [place]. Flu shot today, return as needed.  Prostate cancer Has a low-grade prostate cancer, currently followed by urology. ___________________________________________ Gwen Her. Dianah Field, M.D., ABFM., CAQSM. Primary Care and [place] Instructor of [place] of [facility] of Medicine

## 2017-12-14 ENCOUNTER — Other Ambulatory Visit: Payer: Self-pay | Admitting: Sports Medicine

## 2017-12-14 DIAGNOSIS — M1611 Unilateral primary osteoarthritis, right hip: Secondary | ICD-10-CM

## 2017-12-14 MED ORDER — TRAMADOL HCL 50 MG PO TABS: ORAL_TABLET | ORAL | 0 refills | Status: DC

## 2018-01-15 ENCOUNTER — Other Ambulatory Visit: Payer: Self-pay | Admitting: Sports Medicine

## 2018-01-15 DIAGNOSIS — I10 Essential (primary) hypertension: Secondary | ICD-10-CM

## 2018-04-27 ENCOUNTER — Other Ambulatory Visit: Payer: Self-pay | Admitting: Sports Medicine

## 2018-04-27 DIAGNOSIS — G6289 Other specified polyneuropathies: Secondary | ICD-10-CM

## 2018-07-31 ENCOUNTER — Other Ambulatory Visit: Payer: Self-pay | Admitting: Sports Medicine

## 2018-07-31 DIAGNOSIS — I10 Essential (primary) hypertension: Secondary | ICD-10-CM

## 2018-08-13 ENCOUNTER — Other Ambulatory Visit: Payer: Self-pay | Admitting: Sports Medicine

## 2018-08-13 MED ORDER — OMEPRAZOLE 40 MG PO CPDR: 40.0000 mg | DELAYED_RELEASE_CAPSULE | Freq: Two times a day (BID) | ORAL | 3 refills | Status: DC

## 2018-08-21 ENCOUNTER — Other Ambulatory Visit: Payer: Self-pay | Admitting: Sports Medicine

## 2018-08-21 MED ORDER — OMEPRAZOLE 40 MG PO CPDR: 40.0000 mg | DELAYED_RELEASE_CAPSULE | Freq: Two times a day (BID) | ORAL | 3 refills | Status: DC

## 2018-11-05 ENCOUNTER — Other Ambulatory Visit: Payer: Self-pay

## 2018-11-05 ENCOUNTER — Ambulatory Visit: Payer: Medicare HMO

## 2018-11-05 ENCOUNTER — Ambulatory Visit (INDEPENDENT_AMBULATORY_CARE_PROVIDER_SITE_OTHER): Payer: Medicare HMO

## 2018-11-05 ENCOUNTER — Ambulatory Visit (INDEPENDENT_AMBULATORY_CARE_PROVIDER_SITE_OTHER): Payer: Medicare HMO | Admitting: Sports Medicine

## 2018-11-05 VITALS — BP 137/84 | HR 87

## 2018-11-05 DIAGNOSIS — C61 Malignant neoplasm of prostate: Secondary | ICD-10-CM

## 2018-11-05 DIAGNOSIS — E559 Vitamin D deficiency, unspecified: Secondary | ICD-10-CM | POA: Diagnosis not present

## 2018-11-05 DIAGNOSIS — R059 Cough, unspecified: Secondary | ICD-10-CM | POA: Insufficient documentation

## 2018-11-05 DIAGNOSIS — R05 Cough: Secondary | ICD-10-CM

## 2018-11-05 DIAGNOSIS — E785 Hyperlipidemia, unspecified: Secondary | ICD-10-CM | POA: Diagnosis not present

## 2018-11-05 DIAGNOSIS — Z23 Encounter for immunization: Secondary | ICD-10-CM | POA: Diagnosis not present

## 2018-11-05 DIAGNOSIS — R7309 Other abnormal glucose: Secondary | ICD-10-CM | POA: Diagnosis not present

## 2018-11-05 MED ORDER — DIPHENHYDRAMINE HCL 50 MG PO TABS: 50.0000 mg | ORAL_TABLET | Freq: Every evening | ORAL | 11 refills | Status: DC | PRN

## 2018-11-05 MED ORDER — PANTOPRAZOLE SODIUM 40 MG PO TBEC: 40.0000 mg | DELAYED_RELEASE_TABLET | Freq: Two times a day (BID) | ORAL | 3 refills | Status: DC

## 2018-11-05 NOTE — Assessment & Plan Note (Addendum)
Morning cough, adding Benadryl at night. Already on omeprazole 40 mg twice a day, switching to pantoprazole 40 twice daily. If insufficient improvement we will add an inhaler.

## 2018-11-05 NOTE — Assessment & Plan Note (Addendum)
Low Gleason prostate cancer, checking PSA.  PSA is now up to 6.2, I would suggest revisiting this with his urologist.

## 2018-11-05 NOTE — Progress Notes (Addendum)
Subjective:    CC: Coughing  HPI: Michael Conrad is here with a long history of coughing in the morning, significant throat clearing through the day, moderate, persistent.  He denies any chest pain, no shortness of breath, he is already using omeprazole 40 mg twice a day.  I reviewed the past medical history, family history, social history, surgical history, and allergies today and no changes were needed.  Please see the problem list section below in epic for further details.  Past Medical History: Past Medical History:  Diagnosis Date  . Hyperlipidemia   . Hypertension    Past Surgical History: Past Surgical History:  Procedure Laterality Date  . REVISION TOTAL HIP ARTHROPLASTY    . ROTATOR CUFF REPAIR     Social History: Social History   Socioeconomic History  . Marital status: Divorced    Spouse name: Not on file  . Number of children: Not on file  . Years of education: Not on file  . Highest education level: Not on file  Occupational History  . Not on file  Social Needs  . Financial resource strain: Not on file  . Food insecurity    Worry: Not on file    Inability: Not on file  . Transportation needs    Medical: Not on file    Non-medical: Not on file  Tobacco Use  . Smoking status: Never Smoker  . Smokeless tobacco: Never Used  Substance and Sexual Activity  . Alcohol use: Yes    Alcohol/week: 5.0 standard drinks    Types: 5 Standard drinks or equivalent per week  . Drug use: No  . Sexual activity: Not Currently  Lifestyle  . Physical activity    Days per week: Not on file    Minutes per session: Not on file  . Stress: Not on file  Relationships  . Social Herbalist on phone: Not on file    Gets together: Not on file    Attends religious service: Not on file    Active member of club or organization: Not on file    Attends meetings of clubs or organizations: Not on file    Relationship status: Not on file  Other Topics Concern  . Not on file   Social History Narrative  . Not on file   Family History: Family History  Problem Relation Age of Onset  . Heart disease Mother   . Heart disease Father    Allergies: No Known Allergies Medications: See med rec.  Review of Systems: No fevers, chills, night sweats, weight loss, chest pain, or shortness of breath.   Objective:    General: Well Developed, well nourished, and in no acute distress.  Neuro: Alert and oriented x3, extra-ocular muscles intact, sensation grossly intact.  HEENT: Normocephalic, atraumatic, pupils equal round reactive to light, neck supple, no masses, no lymphadenopathy, thyroid nonpalpable.  Skin: Warm and dry, no rashes. Cardiac: Regular rate and rhythm, no murmurs rubs or gallops, no lower extremity edema.  Respiratory: Clear to auscultation bilaterally. Not using accessory muscles, speaking in full sentences.  Impression and Recommendations:    Coughing Morning cough, adding Benadryl at night. Already on omeprazole 40 mg twice a day, switching to pantoprazole 40 twice daily. If insufficient improvement we will add an inhaler.  Prostate cancer Low Gleason prostate cancer, checking PSA.  PSA is now up to 6.2, I would suggest revisiting this with his urologist.   ___________________________________________ Gwen Her. Dianah Field, M.D., ABFM., CAQSM. Primary Care and  Sports Medicine [facility]  Adjunct Professor of [place] [employer]

## 2018-11-06 LAB — COMPLETE METABOLIC PANEL WITH GFR
AG Ratio: 2 (calc) (ref 1.0–2.5)
ALT: 23 U/L (ref 9–46)
AST: 20 U/L (ref 10–35)
Albumin: 4.3 g/dL (ref 3.6–5.1)
Alkaline phosphatase (APISO): 58 U/L (ref 35–144)
BUN: 17 mg/dL (ref 7–25)
CO2: 24 mmol/L (ref 20–32)
Calcium: 9 mg/dL (ref 8.6–10.3)
Chloride: 102 mmol/L (ref 98–110)
Creat: 1.15 mg/dL (ref 0.70–1.18)
GFR, Est African American: 73 mL/min/{1.73_m2} (ref >=60)
GFR, Est Non African American: 63 mL/min/{1.73_m2} (ref >=60)
Globulin: 2.2 g/dL (calc) (ref 1.9–3.7)
Glucose, Bld: 101 mg/dL (ref 65–139)
Potassium: 4.2 mmol/L (ref 3.5–5.3)
Sodium: 138 mmol/L (ref 135–146)
Total Bilirubin: 0.6 mg/dL (ref 0.2–1.2)
Total Protein: 6.5 g/dL (ref 6.1–8.1)

## 2018-11-06 LAB — LIPID PANEL W/REFLEX DIRECT LDL
Cholesterol: 147 mg/dL (ref <200)
HDL: 43 mg/dL (ref >=40)
LDL Cholesterol (Calc): 79 mg/dL (calc)
Non-HDL Cholesterol (Calc): 104 mg/dL (calc) (ref <130)
Total CHOL/HDL Ratio: 3.4 (calc) (ref <5.0)
Triglycerides: 145 mg/dL (ref <150)

## 2018-11-06 LAB — CBC
HCT: 44.2 % (ref 38.5–50.0)
Hemoglobin: 14.6 g/dL (ref 13.2–17.1)
MCH: 29.9 pg (ref 27.0–33.0)
MCHC: 33 g/dL (ref 32.0–36.0)
MCV: 90.6 fL (ref 80.0–100.0)
MPV: 10.1 fL (ref 7.5–12.5)
Platelets: 239 10*3/uL (ref 140–400)
RBC: 4.88 10*6/uL (ref 4.20–5.80)
RDW: 12.5 % (ref 11.0–15.0)
WBC: 6.4 10*3/uL (ref 3.8–10.8)

## 2018-11-06 LAB — PSA, TOTAL AND FREE
PSA, % Free: 13 % (calc) — ABNORMAL LOW (ref >25)
PSA, Free: 0.8 ng/mL
PSA, Total: 6.2 ng/mL — ABNORMAL HIGH (ref <=4.0)

## 2018-11-06 LAB — HEMOGLOBIN A1C
Hgb A1c MFr Bld: 5.8 % of total Hgb — ABNORMAL HIGH (ref <5.7)
Mean Plasma Glucose: 120 (calc)
eAG (mmol/L): 6.6 (calc)

## 2018-11-06 LAB — VITAMIN D 25 HYDROXY (VIT D DEFICIENCY, FRACTURES): Vit D, 25-Hydroxy: 28 ng/mL — ABNORMAL LOW (ref 30–100)

## 2018-11-06 LAB — TSH: TSH: 3.09 mIU/L (ref 0.40–4.50)

## 2018-11-06 MED ORDER — VITAMIN D (ERGOCALCIFEROL) 1.25 MG (50000 UNIT) PO CAPS: 50000.0000 [IU] | ORAL_CAPSULE | ORAL | 0 refills | Status: DC

## 2018-11-06 NOTE — Addendum Note (Signed)
Addended by: Silverio Decamp on: 11/06/2018 09:27 AM   Modules accepted: Orders

## 2018-12-03 ENCOUNTER — Ambulatory Visit: Payer: Medicare HMO | Admitting: Sports Medicine

## 2019-05-20 ENCOUNTER — Other Ambulatory Visit: Payer: Self-pay | Admitting: Sports Medicine

## 2019-05-20 DIAGNOSIS — G6289 Other specified polyneuropathies: Secondary | ICD-10-CM

## 2019-06-03 ENCOUNTER — Encounter: Payer: Self-pay | Admitting: Sports Medicine

## 2019-06-03 ENCOUNTER — Ambulatory Visit (INDEPENDENT_AMBULATORY_CARE_PROVIDER_SITE_OTHER): Payer: Medicare HMO | Admitting: Sports Medicine

## 2019-06-03 ENCOUNTER — Other Ambulatory Visit: Payer: Self-pay

## 2019-06-03 DIAGNOSIS — E785 Hyperlipidemia, unspecified: Secondary | ICD-10-CM

## 2019-06-03 DIAGNOSIS — I10 Essential (primary) hypertension: Secondary | ICD-10-CM

## 2019-06-03 DIAGNOSIS — L989 Disorder of the skin and subcutaneous tissue, unspecified: Secondary | ICD-10-CM | POA: Diagnosis not present

## 2019-06-03 NOTE — Assessment & Plan Note (Signed)
Michael Conrad has a scaly lesion that is been present chronically on the top of his scalp. This feels and looks a bit like a squamous cell carcinoma, I have recommended that he return for shave biopsy.

## 2019-06-03 NOTE — Progress Notes (Signed)
    Procedures performed today:    None.  Independent interpretation of notes and tests performed by another provider:   None.  Brief History, Exam, Impression, and Recommendations:    Essential hypertension, benign Well-controlled, no changes needed.  Hyperlipidemia Stable on atorvastatin, no change needed.  Skin lesion of scalp Michael Conrad has a scaly lesion that is been present chronically on the top of his scalp. This feels and looks a bit like a squamous cell carcinoma, I have recommended that he return for shave biopsy.    ___________________________________________ Gwen Her. Dianah Field, M.D., ABFM., CAQSM. Primary Care and [place] Instructor of [place] of [facility] of Medicine

## 2019-06-03 NOTE — Assessment & Plan Note (Signed)
Stable on atorvastatin, no change needed.

## 2019-06-03 NOTE — Assessment & Plan Note (Signed)
Well-controlled, no changes needed 

## 2019-06-05 ENCOUNTER — Other Ambulatory Visit: Payer: Self-pay

## 2019-06-05 ENCOUNTER — Ambulatory Visit (INDEPENDENT_AMBULATORY_CARE_PROVIDER_SITE_OTHER): Payer: Medicare HMO | Admitting: Sports Medicine

## 2019-06-05 ENCOUNTER — Encounter: Payer: Self-pay | Admitting: Sports Medicine

## 2019-06-05 ENCOUNTER — Other Ambulatory Visit: Payer: Self-pay | Admitting: Sports Medicine

## 2019-06-05 DIAGNOSIS — L989 Disorder of the skin and subcutaneous tissue, unspecified: Secondary | ICD-10-CM | POA: Diagnosis not present

## 2019-06-05 DIAGNOSIS — L570 Actinic keratosis: Secondary | ICD-10-CM | POA: Diagnosis not present

## 2019-06-05 NOTE — Assessment & Plan Note (Signed)
Elisey returns, I performed a large shave biopsy on his scalp followed by hyfrecation. Aftercare advised, he has tramadol for postoperative pain, further treatment will depend on pathology results.

## 2019-06-05 NOTE — Addendum Note (Signed)
Addended by: Beatris Ship L on: 06/05/2019 11:28 AM   Modules accepted: Orders

## 2019-06-05 NOTE — Progress Notes (Signed)
    Procedures performed today:    Procedure: Shave biopsy of 2 cm scalp lesion Risks, benefits, and alternatives explained and consent obtained. Time out conducted. Surface prepped with alcohol. 5cc lidocaine with epinephine infiltrated in a field block. Adequate anesthesia ensured. Area prepped and draped in a sterile fashion. Excision performed with: Using a derma blade I shaved into the deep dermis, I then achieved hemostasis with a Hyfrecator Pt stable.  Independent interpretation of notes and tests performed by another provider:   None.  Brief History, Exam, Impression, and Recommendations:    Skin lesion of scalp Michael Conrad returns, I performed a large shave biopsy on his scalp followed by hyfrecation. Aftercare advised, he has tramadol for postoperative pain, further treatment will depend on pathology results.    ___________________________________________ Gwen Her. Dianah Field, M.D., ABFM., CAQSM. Primary Care and [place] Instructor of [place] of [facility] of Medicine

## 2019-06-19 ENCOUNTER — Ambulatory Visit (INDEPENDENT_AMBULATORY_CARE_PROVIDER_SITE_OTHER): Payer: Medicare HMO | Admitting: Sports Medicine

## 2019-06-19 ENCOUNTER — Other Ambulatory Visit: Payer: Self-pay

## 2019-06-19 DIAGNOSIS — M1611 Unilateral primary osteoarthritis, right hip: Secondary | ICD-10-CM | POA: Diagnosis not present

## 2019-06-19 DIAGNOSIS — L989 Disorder of the skin and subcutaneous tissue, unspecified: Secondary | ICD-10-CM | POA: Diagnosis not present

## 2019-06-19 MED ORDER — TRAMADOL HCL 50 MG PO TABS: ORAL_TABLET | ORAL | 0 refills | Status: DC

## 2019-06-19 NOTE — Assessment & Plan Note (Signed)
We returns, we did a shave biopsy of a precancerous lesion on his scalp, Derm path shows an actinic keratosis which is the precursor to squamous cell carcinoma, he has a few more on his scalp but desires that we hold off on cryotherapy for now. Incision is clean, dry, intact, there is a scab that I have advised him to leave alone. He will return over the next few months and we can perform cryotherapy, he does use sunscreen and does wear hat most days.

## 2019-06-19 NOTE — Progress Notes (Signed)
    Procedures performed today:    None.  Independent interpretation of notes and tests performed by another provider:   None.  Brief History, Exam, Impression, and Recommendations:    Skin lesion of scalp We returns, we did a shave biopsy of a precancerous lesion on his scalp, Derm path shows an actinic keratosis which is the precursor to squamous cell carcinoma, he has a few more on his scalp but desires that we hold off on cryotherapy for now. Incision is clean, dry, intact, there is a scab that I have advised him to leave alone. He will return over the next few months and we can perform cryotherapy, he does use sunscreen and does wear hat most days.    ___________________________________________ Gwen Her. Dianah Field, M.D., ABFM., CAQSM. Primary Care and [place] Instructor of [place] of [facility] of Medicine

## 2019-07-11 ENCOUNTER — Other Ambulatory Visit: Payer: Self-pay | Admitting: Sports Medicine

## 2019-09-07 ENCOUNTER — Other Ambulatory Visit: Payer: Self-pay | Admitting: Sports Medicine

## 2019-09-09 ENCOUNTER — Other Ambulatory Visit: Payer: Self-pay | Admitting: Sports Medicine

## 2019-09-09 DIAGNOSIS — M1611 Unilateral primary osteoarthritis, right hip: Secondary | ICD-10-CM

## 2019-09-09 MED ORDER — TRAMADOL HCL 50 MG PO TABS: ORAL_TABLET | ORAL | 0 refills | Status: DC

## 2019-09-23 ENCOUNTER — Other Ambulatory Visit: Payer: Self-pay | Admitting: Sports Medicine

## 2019-09-23 DIAGNOSIS — I10 Essential (primary) hypertension: Secondary | ICD-10-CM

## 2019-10-22 ENCOUNTER — Other Ambulatory Visit: Payer: Self-pay

## 2019-10-22 ENCOUNTER — Encounter: Payer: Self-pay | Admitting: Sports Medicine

## 2019-10-22 ENCOUNTER — Ambulatory Visit (INDEPENDENT_AMBULATORY_CARE_PROVIDER_SITE_OTHER): Payer: Medicare HMO | Admitting: Sports Medicine

## 2019-10-22 VITALS — BP 129/86 | HR 77 | Ht 72.0 in | Wt 226.0 lb

## 2019-10-22 DIAGNOSIS — R0789 Other chest pain: Secondary | ICD-10-CM | POA: Diagnosis not present

## 2019-10-22 DIAGNOSIS — E785 Hyperlipidemia, unspecified: Secondary | ICD-10-CM

## 2019-10-22 DIAGNOSIS — E539 Vitamin B deficiency, unspecified: Secondary | ICD-10-CM | POA: Diagnosis not present

## 2019-10-22 DIAGNOSIS — I493 Ventricular premature depolarization: Secondary | ICD-10-CM | POA: Diagnosis not present

## 2019-10-22 DIAGNOSIS — E559 Vitamin D deficiency, unspecified: Secondary | ICD-10-CM

## 2019-10-22 NOTE — Progress Notes (Signed)
    Procedures performed today:    None.  Independent interpretation of notes and tests performed by another provider:   None.  Brief History, Exam, Impression, and Recommendations:    Michael Conrad was a pleasant 74yo male who presents today with an episode of "heart palpitation" that occurred one week ago. It only lasted a few seconds. He had no dizziness, N/V, or SOB during the episode. He did have a 1/6 systolic ejection murmur at the right sternal border possibly suggesting some level of aortic stenosis. A 12 lead EKG today demonstrated PVCs and left axis deviation. We are going to get labs today including CBC, Vitamin B1 and B12, lipid panel, CMP, and TSH to r/o electrolyte abnormalities contributing to arrhythmia. We are also going to get an echo to evaluate for structural abnormalities. We are going to hold off on beta blocker therapy as he has only had 1 symptomatic event. He will follow up as needed.   Marcelino Duster, MS3   ___________________________________________ Gwen Her. Dianah Field, M.D., ABFM., CAQSM. Primary Care and [place] Instructor of [place] of [facility] of Medicine

## 2019-10-22 NOTE — Assessment & Plan Note (Signed)
This is a very poor 74 year old male, he has a history of substance abuse, he has noted an episode of palpitations when laying in his recliner playing with his cat. He did not notice any further episodes. He typically does not exert himself much but recently he did pull in some firewood, multiple wheelbarrows of wood without any chest pain, nausea, diaphoresis, palpitations. He also recalls having an echocardiogram in the distant past that was unremarkable. Today's exam is unremarkable with the exception of a 1/6 systolic ejection murmur in the right second intercostal space consistent with likely mild aortic stenosis. A 12-lead ECG was performed and reviewed by me, he has a normal rhythm at 71 bpm, left axis deviation, as well as occasional PVCs.  Normal PR interval, QRS complex and ST segment. This is minimally symptomatic PVCs, we will get an echocardiogram, routine labs, but because of the paucity of symptoms we will avoid beta-blocker treatment for now.

## 2019-10-27 LAB — COMPLETE METABOLIC PANEL WITH GFR
AG Ratio: 2.1 (calc) (ref 1.0–2.5)
ALT: 24 U/L (ref 9–46)
AST: 20 U/L (ref 10–35)
Albumin: 4.5 g/dL (ref 3.6–5.1)
Alkaline phosphatase (APISO): 54 U/L (ref 35–144)
BUN: 17 mg/dL (ref 7–25)
CO2: 27 mmol/L (ref 20–32)
Calcium: 9 mg/dL (ref 8.6–10.3)
Chloride: 102 mmol/L (ref 98–110)
Creat: 1.1 mg/dL (ref 0.70–1.18)
GFR, Est African American: 77 mL/min/{1.73_m2} (ref >=60)
GFR, Est Non African American: 66 mL/min/{1.73_m2} (ref >=60)
Globulin: 2.1 g/dL (calc) (ref 1.9–3.7)
Glucose, Bld: 93 mg/dL (ref 65–139)
Potassium: 4 mmol/L (ref 3.5–5.3)
Sodium: 136 mmol/L (ref 135–146)
Total Bilirubin: 0.6 mg/dL (ref 0.2–1.2)
Total Protein: 6.6 g/dL (ref 6.1–8.1)

## 2019-10-27 LAB — CBC
HCT: 42.9 % (ref 38.5–50.0)
Hemoglobin: 14.8 g/dL (ref 13.2–17.1)
MCH: 30.8 pg (ref 27.0–33.0)
MCHC: 34.5 g/dL (ref 32.0–36.0)
MCV: 89.2 fL (ref 80.0–100.0)
MPV: 10.2 fL (ref 7.5–12.5)
Platelets: 245 10*3/uL (ref 140–400)
RBC: 4.81 10*6/uL (ref 4.20–5.80)
RDW: 12.3 % (ref 11.0–15.0)
WBC: 6.8 10*3/uL (ref 3.8–10.8)

## 2019-10-27 LAB — VITAMIN D 25 HYDROXY (VIT D DEFICIENCY, FRACTURES): Vit D, 25-Hydroxy: 33 ng/mL (ref 30–100)

## 2019-10-27 LAB — LIPID PANEL W/REFLEX DIRECT LDL
Cholesterol: 163 mg/dL (ref <200)
HDL: 38 mg/dL — ABNORMAL LOW (ref >=40)
LDL Cholesterol (Calc): 89 mg/dL (calc)
Non-HDL Cholesterol (Calc): 125 mg/dL (calc) (ref <130)
Total CHOL/HDL Ratio: 4.3 (calc) (ref <5.0)
Triglycerides: 270 mg/dL — ABNORMAL HIGH (ref <150)

## 2019-10-27 LAB — TSH: TSH: 2.17 mIU/L (ref 0.40–4.50)

## 2019-10-27 LAB — VITAMIN B1: Vitamin B1 (Thiamine): 8 nmol/L (ref 8–30)

## 2019-11-07 ENCOUNTER — Telehealth: Payer: Self-pay | Admitting: *Deleted

## 2019-11-07 NOTE — Telephone Encounter (Signed)
Pt left vm this morning letting us know that he has cancelled his echo for tomorrow.  He also mentioned that we should have his lab results in.  I see them in his chart but looks like they were never noted on.

## 2019-11-07 NOTE — Telephone Encounter (Signed)
That's odd, I see that, labs are in, triglycerides are a bit high but otherwise everything else looks good.

## 2019-11-11 ENCOUNTER — Ambulatory Visit (HOSPITAL_BASED_OUTPATIENT_CLINIC_OR_DEPARTMENT_OTHER): Payer: Medicare HMO

## 2019-11-15 NOTE — Telephone Encounter (Signed)
Pt was notified.  

## 2019-12-03 ENCOUNTER — Other Ambulatory Visit: Payer: Self-pay | Admitting: Sports Medicine

## 2019-12-03 DIAGNOSIS — M1611 Unilateral primary osteoarthritis, right hip: Secondary | ICD-10-CM

## 2019-12-03 MED ORDER — TRAMADOL HCL 50 MG PO TABS: ORAL_TABLET | ORAL | 0 refills | Status: DC

## 2019-12-12 ENCOUNTER — Telehealth: Payer: Self-pay

## 2019-12-12 NOTE — Telephone Encounter (Signed)
Patient called to states that he is ready to have the ECHO now.  He had an episode Wednesday where he got very winded from doing very minimal work outside his house.  He talked it over with his family and they want him to have it as well.

## 2019-12-13 NOTE — Telephone Encounter (Signed)
Called [employer] and updated auth dates. New dates are from 11-5 until 01-12-20

## 2019-12-13 NOTE — Telephone Encounter (Signed)
Order in chart from September, okay to go ahead and send this to referrals for scheduling.

## 2019-12-13 NOTE — Telephone Encounter (Signed)
Please refer to previous ECHO order from September and get patient scheduled.

## 2019-12-17 ENCOUNTER — Other Ambulatory Visit: Payer: Self-pay

## 2019-12-17 DIAGNOSIS — I350 Nonrheumatic aortic (valve) stenosis: Secondary | ICD-10-CM

## 2019-12-17 NOTE — Progress Notes (Signed)
Reordered ECHO.

## 2020-01-08 ENCOUNTER — Other Ambulatory Visit: Payer: Self-pay

## 2020-01-08 ENCOUNTER — Ambulatory Visit (HOSPITAL_BASED_OUTPATIENT_CLINIC_OR_DEPARTMENT_OTHER)
Admission: RE | Admit: 2020-01-08 | Discharge: 2020-01-08 | Disposition: A | Discharge status: Home / Self Care | Payer: Medicare HMO | Source: Ambulatory Visit | Attending: Sports Medicine | Admitting: Sports Medicine

## 2020-01-08 DIAGNOSIS — I350 Nonrheumatic aortic (valve) stenosis: Secondary | ICD-10-CM | POA: Diagnosis present

## 2020-01-08 LAB — ECHOCARDIOGRAM COMPLETE
Area-P 1/2: 4.8 cm2
S' Lateral: 2.93 cm

## 2020-02-03 ENCOUNTER — Other Ambulatory Visit: Payer: Self-pay | Admitting: Sports Medicine

## 2020-02-03 DIAGNOSIS — M1611 Unilateral primary osteoarthritis, right hip: Secondary | ICD-10-CM

## 2020-02-03 MED ORDER — TRAMADOL HCL 50 MG PO TABS: ORAL_TABLET | ORAL | 0 refills | Status: DC

## 2020-03-04 ENCOUNTER — Other Ambulatory Visit: Payer: Self-pay | Admitting: Sports Medicine

## 2020-03-04 DIAGNOSIS — M1611 Unilateral primary osteoarthritis, right hip: Secondary | ICD-10-CM

## 2020-03-04 MED ORDER — TRAMADOL HCL 50 MG PO TABS: ORAL_TABLET | ORAL | 0 refills | Status: DC

## 2020-04-28 ENCOUNTER — Telehealth: Payer: Self-pay

## 2020-04-28 DIAGNOSIS — C61 Malignant neoplasm of prostate: Secondary | ICD-10-CM

## 2020-04-28 NOTE — Telephone Encounter (Signed)
Interesting, ordered.

## 2020-04-28 NOTE — Telephone Encounter (Signed)
Patient aware that orders have been placed and faxed to lab. He will stop by and have them drawn prior to his appt.

## 2020-04-28 NOTE — Telephone Encounter (Signed)
Patient called requesting to have his PSA drawn prior to his appt with you on 05/05/20. He had it done at the [place] but feels like it is "not right" and would like it repeated. He wants to discuss this with you at his appt.

## 2020-05-01 DIAGNOSIS — C61 Malignant neoplasm of prostate: Secondary | ICD-10-CM | POA: Diagnosis not present

## 2020-05-04 LAB — PSA, TOTAL AND FREE
PSA, % Free: 18 % (calc) — ABNORMAL LOW (ref >25)
PSA, Free: 1.7 ng/mL
PSA, Total: 9.3 ng/mL — ABNORMAL HIGH (ref <=4.0)

## 2020-05-05 ENCOUNTER — Other Ambulatory Visit: Payer: Self-pay

## 2020-05-05 ENCOUNTER — Ambulatory Visit (INDEPENDENT_AMBULATORY_CARE_PROVIDER_SITE_OTHER): Payer: Medicare HMO | Admitting: Sports Medicine

## 2020-05-05 ENCOUNTER — Telehealth: Payer: Self-pay

## 2020-05-05 ENCOUNTER — Encounter: Payer: Self-pay | Admitting: Sports Medicine

## 2020-05-05 VITALS — BP 136/83 | HR 68 | Ht 72.0 in | Wt 231.0 lb

## 2020-05-05 DIAGNOSIS — Z1211 Encounter for screening for malignant neoplasm of colon: Secondary | ICD-10-CM

## 2020-05-05 DIAGNOSIS — F330 Major depressive disorder, recurrent, mild: Secondary | ICD-10-CM | POA: Diagnosis not present

## 2020-05-05 DIAGNOSIS — G6289 Other specified polyneuropathies: Secondary | ICD-10-CM | POA: Diagnosis not present

## 2020-05-05 DIAGNOSIS — Z0000 Encounter for general adult medical examination without abnormal findings: Secondary | ICD-10-CM

## 2020-05-05 DIAGNOSIS — C61 Malignant neoplasm of prostate: Secondary | ICD-10-CM | POA: Diagnosis not present

## 2020-05-05 DIAGNOSIS — I10 Essential (primary) hypertension: Secondary | ICD-10-CM | POA: Diagnosis not present

## 2020-05-05 MED ORDER — TRAZODONE HCL 150 MG PO TABS: 150.0000 mg | ORAL_TABLET | Freq: Every day | ORAL | 3 refills | Status: DC

## 2020-05-05 MED ORDER — DULOXETINE HCL 30 MG PO CPEP: 30.0000 mg | ORAL_CAPSULE | Freq: Every day | ORAL | 3 refills | Status: DC

## 2020-05-05 NOTE — Assessment & Plan Note (Addendum)
Blood pressure is elevated today, improved on recheck.

## 2020-05-05 NOTE — Telephone Encounter (Signed)
Patient called back after his appt to let us know the name of the medication he is taking for neuropathy, oxcarbazepine 600mg .

## 2020-05-05 NOTE — Progress Notes (Signed)
Subjective:    CC: Annual Physical Exam  HPI:  This patient is here for their annual physical  I reviewed the past medical history, family history, social history, surgical history, and allergies today and no changes were needed.  Please see the problem list section below in epic for further details.  Past Medical History: Past Medical History:  Diagnosis Date  . Hyperlipidemia   . Hypertension    Past Surgical History: Past Surgical History:  Procedure Laterality Date  . REVISION TOTAL HIP ARTHROPLASTY    . ROTATOR CUFF REPAIR     Social History: Social History   Socioeconomic History  . Marital status: Divorced    Spouse name: Not on file  . Number of children: Not on file  . Years of education: Not on file  . Highest education level: Not on file  Occupational History  . Not on file  Tobacco Use  . Smoking status: Never Smoker  . Smokeless tobacco: Never Used  Substance and Sexual Activity  . Alcohol use: Yes    Alcohol/week: 5.0 standard drinks    Types: 5 Standard drinks or equivalent per week  . Drug use: No  . Sexual activity: Not Currently  Other Topics Concern  . Not on file  Social History Narrative  . Not on file   Social Determinants of Health   Financial Resource Strain: Not on file  Food Insecurity: Not on file  Transportation Needs: Not on file  Physical Activity: Not on file  Stress: Not on file  Social Connections: Not on file   Family History: Family History  Problem Relation Age of Onset  . Heart disease Mother   . Heart disease Father    Allergies: Allergies  Allergen Reactions  . Dust Mite Extract Cough   Medications: See med rec.  Review of Systems: No headache, visual changes, nausea, vomiting, diarrhea, constipation, dizziness, abdominal pain, skin rash, fevers, chills, night sweats, swollen lymph nodes, weight loss, chest pain, body aches, joint swelling, muscle aches, shortness of breath, mood changes, visual or auditory  hallucinations.  Objective:    General: Well Developed, well nourished, and in no acute distress.  Neuro: Alert and oriented x3, extra-ocular muscles intact, sensation grossly intact. Cranial nerves II through XII are intact, motor, sensory, and coordinative functions are all intact. HEENT: Normocephalic, atraumatic, pupils equal round reactive to light, neck supple, no masses, no lymphadenopathy, thyroid nonpalpable. Oropharynx, nasopharynx, external ear canals are unremarkable. Skin: Warm and dry, no rashes noted.  Cardiac: Regular rate and rhythm, no murmurs rubs or gallops.  Respiratory: Clear to auscultation bilaterally. Not using accessory muscles, speaking in full sentences.  Abdominal: Soft, nontender, nondistended, positive bowel sounds, no masses, no organomegaly.  Musculoskeletal: Shoulder, elbow, wrist, hip, knee, ankle stable, and with full range of motion.  Impression and Recommendations:    The patient was counselled, risk factors were discussed, anticipatory guidance given.  Annual physical exam Annual physical as above, [name] is up-to-date on his labs. We will add Cologuard and this will be his last colon cancer screen.  Major depression Bill does have depression, historically he was controlled on Celexa, self discontinued this. He is having increasing pain, depressive symptoms. Anhedonia and agoraphobia. Adding Cymbalta 30. Worsening insomnia, increasing trazodone to 150 nightly. Return to see me in 6 weeks.  Prostate cancer Currently managed with urology.  He is not interested in aggressive treatment, he does have a history of prostate cancer and his PSA did jump up to 10.  Essential hypertension, benign Blood pressure is elevated today, improved on recheck.   ___________________________________________ Gwen Her. Dianah Field, M.D., ABFM., CAQSM. Primary Care and Sports Medicine [facility]  Adjunct Professor of [place] of [facility] of Medicine

## 2020-05-05 NOTE — Telephone Encounter (Signed)
Excellent, there will be no interaction, all is well, no change in plan.

## 2020-05-05 NOTE — Assessment & Plan Note (Signed)
Currently managed with urology.  He is not interested in aggressive treatment, he does have a history of prostate cancer and his PSA did jump up to 10.

## 2020-05-05 NOTE — Assessment & Plan Note (Signed)
Michael Conrad does have depression, historically he was controlled on Celexa, self discontinued this. He is having increasing pain, depressive symptoms. Anhedonia and agoraphobia. Adding Cymbalta 30. Worsening insomnia, increasing trazodone to 150 nightly. Return to see me in 6 weeks.

## 2020-05-05 NOTE — Assessment & Plan Note (Signed)
Annual physical as above, [name] is up-to-date on his labs. We will add Cologuard and this will be his last colon cancer screen.

## 2020-05-07 NOTE — Telephone Encounter (Signed)
Patient aware.

## 2020-05-11 DIAGNOSIS — Z1211 Encounter for screening for malignant neoplasm of colon: Secondary | ICD-10-CM | POA: Diagnosis not present

## 2020-05-15 ENCOUNTER — Encounter: Payer: Self-pay | Admitting: Sports Medicine

## 2020-05-15 LAB — COLOGUARD
COLOGUARD: NEGATIVE
Cologuard: NEGATIVE

## 2020-05-15 NOTE — Progress Notes (Signed)
Received results from Cologuard. These were abstracted into chart. Patient called and made aware of results. Sent for scanning into chart.

## 2020-05-19 ENCOUNTER — Telehealth: Payer: Self-pay

## 2020-05-19 NOTE — Telephone Encounter (Signed)
He is eligible for his fourth vaccine, it technically needs to be approximately 4 months after his last 1, so if it was December 3 he is eligible for now.  It would probably be a good idea before going to [facility] the [facility].

## 2020-05-19 NOTE — Telephone Encounter (Signed)
Patient aware and will find a location to get this done.

## 2020-05-19 NOTE — Telephone Encounter (Signed)
Patient is going to be headed [facility] for his granddaugther's wedding. This is a 4 day event. He wanted your advice on getting the 4th Moderna booster. He had the last one on 01/10/20.

## 2020-05-30 ENCOUNTER — Other Ambulatory Visit: Payer: Self-pay | Admitting: Sports Medicine

## 2020-05-30 DIAGNOSIS — M1611 Unilateral primary osteoarthritis, right hip: Secondary | ICD-10-CM

## 2020-06-16 ENCOUNTER — Other Ambulatory Visit: Payer: Self-pay

## 2020-06-16 ENCOUNTER — Ambulatory Visit (INDEPENDENT_AMBULATORY_CARE_PROVIDER_SITE_OTHER): Payer: Medicare HMO | Admitting: Sports Medicine

## 2020-06-16 DIAGNOSIS — J302 Other seasonal allergic rhinitis: Secondary | ICD-10-CM | POA: Diagnosis not present

## 2020-06-16 DIAGNOSIS — F330 Major depressive disorder, recurrent, mild: Secondary | ICD-10-CM

## 2020-06-16 MED ORDER — FEXOFENADINE HCL 180 MG PO TABS
180.0000 mg | ORAL_TABLET | Freq: Every day | ORAL | 3 refills | Status: DC
Start: 2020-06-16 — End: 2020-12-02

## 2020-06-16 MED ORDER — DULOXETINE HCL 30 MG PO CPEP: 30.0000 mg | ORAL_CAPSULE | Freq: Every day | ORAL | 3 refills | Status: DC

## 2020-06-16 MED ORDER — FEXOFENADINE HCL 180 MG PO TABS
180.0000 mg | ORAL_TABLET | Freq: Every day | ORAL | 3 refills | Status: DC
Start: 2020-06-16 — End: 2020-06-16

## 2020-06-16 NOTE — Assessment & Plan Note (Signed)
Itching, sneezing, watery eyes, coughing. Likely seasonal allergic rhinitis, adding Allegra. Return as needed for this, I would recommend that he continue the antihistamine through June or July.

## 2020-06-16 NOTE — Assessment & Plan Note (Addendum)
Michael Conrad has depression, historically controlled with Celexa, he did self discontinue it, at the last visit he was having increasing pain, depressive symptoms, anhedonia, agoraphobia, we added Cymbalta and he did extremely well with almost complete resolution in all of his symptoms. We had also increased his trazodone to 150 nightly which helped. He unfortunately ran out of Cymbalta and did not realize he had 3 more refills, he has some withdrawal symptoms and is here requesting more. This is a chronic process with exacerbation of symptoms due to running out of medication. Refilling with a year supply. No suicidal, homicidal ideation.

## 2020-06-16 NOTE — Progress Notes (Signed)
    Procedures performed today:    None.  Independent interpretation of notes and tests performed by another provider:   None.  Brief History, Exam, Impression, and Recommendations:    Major depression Bill has depression, historically controlled with Celexa, he did self discontinue it, at the last visit he was having increasing pain, depressive symptoms, anhedonia, agoraphobia, we added Cymbalta and he did extremely well with almost complete resolution in all of his symptoms. We had also increased his trazodone to 150 nightly which helped. He unfortunately ran out of Cymbalta and did not realize he had 3 more refills, he has some withdrawal symptoms and is here requesting more. This is a chronic process with exacerbation of symptoms due to running out of medication. Refilling with a year supply. No suicidal, homicidal ideation.  Seasonal allergies Itching, sneezing, watery eyes, coughing. Likely seasonal allergic rhinitis, adding Allegra. Return as needed for this, I would recommend that he continue the antihistamine through June or July.    ___________________________________________ Gwen Her. Dianah Field, M.D., ABFM., CAQSM. Primary Care and [place] Instructor of [place] of [facility] of Medicine

## 2020-07-03 ENCOUNTER — Telehealth: Payer: Self-pay | Admitting: Sports Medicine

## 2020-07-03 NOTE — Progress Notes (Signed)
  Chronic Care Management   Note  07/03/2020 Name: Michael Conrad MRN: [id number] DOB: April 16, 1945  Michael Conrad is a 75 y.o. year old male who is a primary care patient of Dianah Field, Gwen Her, MD. I reached out to Michael Conrad by phone today in response to a referral sent by Mr. Matix Henshaw Rosenboom's PCP, Silverio Decamp, MD.   Mr. Vaca was given information about Chronic Care Management services today including:  1. CCM service includes personalized support from designated clinical staff supervised by his physician, including individualized Conrad of care and coordination with other care providers 2. 24/7 contact phone numbers for assistance for urgent and routine care needs. 3. Service will only be billed when office clinical staff spend 20 minutes or more in a month to coordinate care. 4. Only one practitioner may furnish and bill the service in a calendar month. 5. The patient may stop CCM services at any time (effective at the end of the month) by phone call to the office staff.   Patient did not agree to enrollment in care management services and does not wish to consider at this time.  Follow up Conrad:   Lauretta Grill [employer] Scheduler

## 2020-08-16 ENCOUNTER — Other Ambulatory Visit: Payer: Self-pay | Admitting: Sports Medicine

## 2020-08-16 DIAGNOSIS — I10 Essential (primary) hypertension: Secondary | ICD-10-CM

## 2020-09-28 ENCOUNTER — Ambulatory Visit: Payer: Medicare HMO | Admitting: Sports Medicine

## 2020-10-08 ENCOUNTER — Ambulatory Visit (INDEPENDENT_AMBULATORY_CARE_PROVIDER_SITE_OTHER): Payer: Medicare HMO | Admitting: Sports Medicine

## 2020-10-08 ENCOUNTER — Other Ambulatory Visit: Payer: Self-pay

## 2020-10-08 DIAGNOSIS — F330 Major depressive disorder, recurrent, mild: Secondary | ICD-10-CM | POA: Diagnosis not present

## 2020-10-08 MED ORDER — DULOXETINE HCL 30 MG PO CPEP
30.0000 mg | ORAL_CAPSULE | Freq: Two times a day (BID) | ORAL | 3 refills | Status: DC
Start: 2020-10-08 — End: 2021-08-12

## 2020-10-08 NOTE — Progress Notes (Signed)
    Procedures performed today:    None.  Independent interpretation of notes and tests performed by another provider:   None.  Brief History, Exam, Impression, and Recommendations:    No problem-specific Assessment & Plan notes found for this encounter.    ___________________________________________ Deundre Thong J. Danisa Kopec, M.D., ABFM., CAQSM. Primary Care and Sports Medicine [facility]  Adjunct Instructor of Family Medicine  [employer] 

## 2020-10-08 NOTE — Assessment & Plan Note (Signed)
Bill's depression is now well controlled with Cymbalta, he started taking it twice daily and symptoms have improved considerably, he has good energy, good mood, he just got a hot tub and is excited about using it. His tinnitus has resolved. He will continue trazodone 150 at night which is helpful for sleep. No changes in plan.

## 2020-11-04 ENCOUNTER — Other Ambulatory Visit: Payer: Self-pay | Admitting: Sports Medicine

## 2020-11-04 DIAGNOSIS — M1611 Unilateral primary osteoarthritis, right hip: Secondary | ICD-10-CM

## 2020-11-06 ENCOUNTER — Ambulatory Visit: Payer: Medicare HMO | Admitting: Sports Medicine

## 2020-11-16 ENCOUNTER — Ambulatory Visit (INDEPENDENT_AMBULATORY_CARE_PROVIDER_SITE_OTHER): Payer: Medicare HMO

## 2020-11-16 ENCOUNTER — Encounter: Payer: Self-pay | Admitting: Sports Medicine

## 2020-11-16 ENCOUNTER — Ambulatory Visit (INDEPENDENT_AMBULATORY_CARE_PROVIDER_SITE_OTHER): Payer: Medicare HMO | Admitting: Sports Medicine

## 2020-11-16 VITALS — Ht 72.0 in | Wt 217.0 lb

## 2020-11-16 DIAGNOSIS — M1611 Unilateral primary osteoarthritis, right hip: Secondary | ICD-10-CM

## 2020-11-16 DIAGNOSIS — Z23 Encounter for immunization: Secondary | ICD-10-CM | POA: Diagnosis not present

## 2020-11-16 NOTE — Assessment & Plan Note (Signed)
Michael Conrad returns, he is a pleasant 75 year old male with severe right hip osteoarthritis on x-rays from 2019, post left hip arthroplasty and doing well. Last injected in 2016. Repeat right hip joint injection today, if he does not get 3 months of relief we will consider arthroplasty.

## 2020-11-16 NOTE — Progress Notes (Signed)
    Procedures performed today:    Procedure: Real-time Ultrasound Guided injection of the right hip joint Device: Samsung HS60  Verbal informed consent obtained.  Time-out conducted.  Noted no overlying erythema, induration, or other signs of local infection.  Skin prepped in a sterile fashion.  Local anesthesia: Topical Ethyl chloride.  With sterile technique and under real time ultrasound guidance: Noted arthritic joint, 1 cc Kenalog 40, 2 cc lidocaine, 2 cc bupivacaine injected easily Completed without difficulty  Advised to call if fevers/chills, erythema, induration, drainage, or persistent bleeding.  Images permanently stored and available for review in PACS.  Impression: Technically successful ultrasound guided injection.  Independent interpretation of notes and tests performed by another provider:   None.  Brief History, Exam, Impression, and Recommendations:    Primary osteoarthritis of right hip [name] returns, he is a pleasant 75 year old male with severe right hip osteoarthritis on x-rays from 2019, post left hip arthroplasty and doing well. Last injected in 2016. Repeat right hip joint injection today, if he does not get 3 months of relief we will consider arthroplasty.    ___________________________________________ Gwen Her. Dianah Field, M.D., ABFM., CAQSM. Primary Care and [place] Instructor of [place] of [facility] of Medicine

## 2020-11-30 ENCOUNTER — Telehealth: Payer: Self-pay | Admitting: Sports Medicine

## 2020-11-30 NOTE — Telephone Encounter (Signed)
Pt called. Pt states he is still having pain since getting shot two weeks age. He states Dr. Darene Lamer discussed getting an xray.

## 2020-11-30 NOTE — Telephone Encounter (Signed)
That is disappointing, is the tramadol helping to some degree?  He can use up to 2 tabs twice a day, ultimately this means he will likely need a hip replacement.

## 2020-11-30 NOTE — Telephone Encounter (Signed)
After speaking with patient, he is adamant that he wants xrays of his hip and feet. Transferred call tot he front desk for scheduling.

## 2020-12-02 ENCOUNTER — Ambulatory Visit (INDEPENDENT_AMBULATORY_CARE_PROVIDER_SITE_OTHER): Payer: Medicare HMO | Admitting: Sports Medicine

## 2020-12-02 DIAGNOSIS — M1611 Unilateral primary osteoarthritis, right hip: Secondary | ICD-10-CM

## 2020-12-02 DIAGNOSIS — M51369 Other intervertebral disc degeneration, lumbar region without mention of lumbar back pain or lower extremity pain: Secondary | ICD-10-CM

## 2020-12-02 DIAGNOSIS — M5136 Other intervertebral disc degeneration, lumbar region: Secondary | ICD-10-CM

## 2020-12-02 NOTE — Progress Notes (Signed)
    Procedures performed today:    None.  Independent interpretation of notes and tests performed by another provider:   None.  Brief History, Exam, Impression, and Recommendations:    Primary osteoarthritis of right hip This is a very pleasant 75 year old male, severe right hip osteoarthritis, post left hip arthroplasty, injected earlier this month, only got a couple of weeks relief, ultimately tramadol use 2-3 times a day has given him good control of all of his painful symptoms. We can refill this as needed, return as needed.  Lumbar degenerative disc disease Multilevel lumbar spinal stenosis, initially had a good response to osteopathic manipulation with Dr. Sheppard Coil, he is getting cramping in the legs, likely neurogenic claudication. He will continue to use tramadol for this and if insufficient improvement we will consider interventional injections.    ___________________________________________ Gwen Her. Dianah Field, M.D., ABFM., CAQSM. Primary Care and [place] Instructor of [place] of [facility] of Medicine

## 2020-12-02 NOTE — Assessment & Plan Note (Signed)
Multilevel lumbar spinal stenosis, initially had a good response to osteopathic manipulation with Dr. Sheppard Coil, he is getting cramping in the legs, likely neurogenic claudication. He will continue to use tramadol for this and if insufficient improvement we will consider interventional injections.

## 2020-12-02 NOTE — Assessment & Plan Note (Signed)
This is a very pleasant 75 year old male, severe right hip osteoarthritis, post left hip arthroplasty, injected earlier this month, only got a couple of weeks relief, ultimately tramadol use 2-3 times a day has given him good control of all of his painful symptoms. We can refill this as needed, return as needed.

## 2021-01-05 ENCOUNTER — Telehealth: Payer: Self-pay

## 2021-01-05 NOTE — Telephone Encounter (Signed)
Spoke with patient. He has decided he wants to come in to speak with you about other options outside of sx. He will schedule appt.   Note: He states he has taken 5 Tramadol today and this does not help as long as it use to.

## 2021-01-05 NOTE — Telephone Encounter (Signed)
Patient called in stating his right hip is worse. He is having a hard time walking. He would like to know what steps to take for hip replacement.

## 2021-01-05 NOTE — Telephone Encounter (Signed)
He would just need a referral to an orthopedic surgeon, he can either choose 1 or I can choose 1.

## 2021-01-12 ENCOUNTER — Ambulatory Visit (INDEPENDENT_AMBULATORY_CARE_PROVIDER_SITE_OTHER): Payer: Medicare HMO | Admitting: Sports Medicine

## 2021-01-12 ENCOUNTER — Other Ambulatory Visit: Payer: Self-pay

## 2021-01-12 DIAGNOSIS — M1611 Unilateral primary osteoarthritis, right hip: Secondary | ICD-10-CM

## 2021-01-12 MED ORDER — TRAMADOL HCL ER 200 MG PO TB24: 200.0000 mg | ORAL_TABLET | Freq: Every day | ORAL | 3 refills | Status: DC

## 2021-01-12 NOTE — Assessment & Plan Note (Signed)
Michael Conrad continues to have severe pain in his right hip in spite of injections, activity modification, medications, he has x-ray confirmed osteoarthritis and I think at this point he is ready for hip replacement. In the meantime he is currently taking approximately 200 mg of tramadol immediate release daily, we will switch to the 200 mg extended release tramadol, this will allow him to take up to 2 immediate release tramadol as needed in addition.

## 2021-01-12 NOTE — Progress Notes (Signed)
    Procedures performed today:    None.  Independent interpretation of notes and tests performed by another provider:   None.  Brief History, Exam, Impression, and Recommendations:    Primary osteoarthritis of right hip Michael Conrad continues to have severe pain in his right hip in spite of injections, activity modification, medications, he has x-ray confirmed osteoarthritis and I think at this point he is ready for hip replacement. In the meantime he is currently taking approximately 200 mg of tramadol immediate release daily, we will switch to the 200 mg extended release tramadol, this will allow him to take up to 2 immediate release tramadol as needed in addition.  Chronic process with exacerbation and pharmacologic intervention  ___________________________________________ Gwen Her. Dianah Field, M.D., ABFM., CAQSM. Primary Care and [place] Instructor of [place] of [facility] of Medicine

## 2021-01-21 ENCOUNTER — Telehealth: Payer: Self-pay

## 2021-01-21 DIAGNOSIS — M51369 Other intervertebral disc degeneration, lumbar region without mention of lumbar back pain or lower extremity pain: Secondary | ICD-10-CM

## 2021-01-21 DIAGNOSIS — M5136 Other intervertebral disc degeneration, lumbar region: Secondary | ICD-10-CM

## 2021-01-21 NOTE — Telephone Encounter (Signed)
Patient was scheduled for a shot in the back here. After speaking with patient, he understands that we don't do those here. He stated that if you feel this will help his pain he would be willing to have one done. Please advise.

## 2021-01-22 NOTE — Telephone Encounter (Signed)
Spoke with [employer]. He will await call for mri. He is requesting to have injection done at [facility] in [place]. He is interested in est care for pain management since Tramadol works well

## 2021-01-22 NOTE — Telephone Encounter (Signed)
Okay we will go ahead and get an MRI of his lumbar spine, this is needed before an epidural injection, once I see the results of the MRI I will order the injection.

## 2021-01-26 ENCOUNTER — Ambulatory Visit: Payer: Medicare HMO | Admitting: Sports Medicine

## 2021-02-09 ENCOUNTER — Ambulatory Visit (INDEPENDENT_AMBULATORY_CARE_PROVIDER_SITE_OTHER): Payer: Medicare HMO

## 2021-02-09 ENCOUNTER — Other Ambulatory Visit: Payer: Self-pay

## 2021-02-09 ENCOUNTER — Telehealth: Payer: Self-pay | Admitting: Sports Medicine

## 2021-02-09 DIAGNOSIS — M5136 Other intervertebral disc degeneration, lumbar region: Secondary | ICD-10-CM

## 2021-02-09 DIAGNOSIS — M549 Dorsalgia, unspecified: Secondary | ICD-10-CM

## 2021-02-09 DIAGNOSIS — M51369 Other intervertebral disc degeneration, lumbar region without mention of lumbar back pain or lower extremity pain: Secondary | ICD-10-CM

## 2021-02-09 DIAGNOSIS — M48061 Spinal stenosis, lumbar region without neurogenic claudication: Secondary | ICD-10-CM | POA: Diagnosis not present

## 2021-02-09 NOTE — Telephone Encounter (Signed)
Patient was in for imaging today and came up to check to see if you've had a chance to send information to get him into [facility]? at [facility]. He stated he gave you information , Dr's name, etc, it was about getting a shot in the back. Please Advise.

## 2021-02-10 NOTE — Telephone Encounter (Signed)
Patient aware referral has been placed per his request.

## 2021-02-10 NOTE — Telephone Encounter (Signed)
referral placed

## 2021-02-12 ENCOUNTER — Telehealth: Payer: Self-pay

## 2021-02-12 NOTE — Telephone Encounter (Signed)
Sent referral to [place] [facility]

## 2021-02-12 NOTE — Telephone Encounter (Signed)
Patient wants to go to [facility] not [facility]. Please resubmit the referral to the appropriate location.   Thanks,  Eian Vandervelden

## 2021-03-04 DIAGNOSIS — M25551 Pain in right hip: Secondary | ICD-10-CM | POA: Diagnosis not present

## 2021-03-04 DIAGNOSIS — M48062 Spinal stenosis, lumbar region with neurogenic claudication: Secondary | ICD-10-CM | POA: Diagnosis not present

## 2021-03-04 DIAGNOSIS — M47816 Spondylosis without myelopathy or radiculopathy, lumbar region: Secondary | ICD-10-CM | POA: Diagnosis not present

## 2021-03-18 DIAGNOSIS — M25551 Pain in right hip: Secondary | ICD-10-CM | POA: Diagnosis not present

## 2021-03-18 DIAGNOSIS — Z79899 Other long term (current) drug therapy: Secondary | ICD-10-CM | POA: Diagnosis not present

## 2021-03-18 DIAGNOSIS — M1612 Unilateral primary osteoarthritis, left hip: Secondary | ICD-10-CM | POA: Diagnosis not present

## 2021-03-18 DIAGNOSIS — Z96641 Presence of right artificial hip joint: Secondary | ICD-10-CM | POA: Diagnosis not present

## 2021-04-05 ENCOUNTER — Ambulatory Visit (INDEPENDENT_AMBULATORY_CARE_PROVIDER_SITE_OTHER): Payer: Medicare HMO

## 2021-04-05 ENCOUNTER — Ambulatory Visit (INDEPENDENT_AMBULATORY_CARE_PROVIDER_SITE_OTHER): Payer: Medicare HMO | Admitting: Sports Medicine

## 2021-04-05 ENCOUNTER — Other Ambulatory Visit: Payer: Self-pay

## 2021-04-05 DIAGNOSIS — M25561 Pain in right knee: Secondary | ICD-10-CM | POA: Diagnosis not present

## 2021-04-05 DIAGNOSIS — Z09 Encounter for follow-up examination after completed treatment for conditions other than malignant neoplasm: Secondary | ICD-10-CM

## 2021-04-05 DIAGNOSIS — M1711 Unilateral primary osteoarthritis, right knee: Secondary | ICD-10-CM

## 2021-04-05 DIAGNOSIS — M1712 Unilateral primary osteoarthritis, left knee: Secondary | ICD-10-CM | POA: Diagnosis not present

## 2021-04-05 NOTE — Assessment & Plan Note (Signed)
This is a very pleasant 76 year old male, known hip osteoarthritis, scheduled for hip arthroplasty, increasing pain right knee, medial joint line moderate swelling. He is hoping to get this treated aggressively so that he can use the extremity while he is having difficulty ambulating postop. Knee injection today. X-rays today. Return to see me in 4 to 6 weeks for this.

## 2021-04-05 NOTE — Progress Notes (Signed)
° ° °  Procedures performed today:    Procedure: Real-time Ultrasound Guided injection of the right knee Device: Samsung HS60  Verbal informed consent obtained.  Time-out conducted.  Noted no overlying erythema, induration, or other signs of local infection.  Skin prepped in a sterile fashion.  Local anesthesia: Topical Ethyl chloride.  With sterile technique and under real time ultrasound guidance: Noted mild effusion, 1 cc Kenalog 40, 2 cc lidocaine, 2 cc bupivacaine injected easily Completed without difficulty  Advised to call if fevers/chills, erythema, induration, drainage, or persistent bleeding.  Images permanently stored and available for review in PACS.  Impression: Technically successful ultrasound guided injection.  Independent interpretation of notes and tests performed by another provider:   None.  Brief History, Exam, Impression, and Recommendations:    Primary osteoarthritis of right knee This is a very pleasant 76 year old male, known hip osteoarthritis, scheduled for hip arthroplasty, increasing pain right knee, medial joint line moderate swelling. He is hoping to get this treated aggressively so that he can use the extremity while he is having difficulty ambulating postop. Knee injection today. X-rays today. Return to see me in 4 to 6 weeks for this.    ___________________________________________ Gwen Her. Dianah Field, M.D., ABFM., CAQSM. Primary Care and [place] Instructor of [place] of [facility] of Medicine

## 2021-04-07 HISTORY — PX: TOTAL HIP ARTHROPLASTY: SHX124

## 2021-04-14 ENCOUNTER — Other Ambulatory Visit: Payer: Self-pay | Admitting: Sports Medicine

## 2021-04-14 DIAGNOSIS — G6289 Other specified polyneuropathies: Secondary | ICD-10-CM

## 2021-04-15 ENCOUNTER — Other Ambulatory Visit: Payer: Self-pay

## 2021-04-15 MED ORDER — OMEPRAZOLE 40 MG PO CPDR
40.0000 mg | DELAYED_RELEASE_CAPSULE | Freq: Two times a day (BID) | ORAL | 3 refills | Status: DC
Start: 2021-04-15 — End: 2022-02-01

## 2021-05-03 DIAGNOSIS — I10 Essential (primary) hypertension: Secondary | ICD-10-CM | POA: Diagnosis not present

## 2021-05-03 DIAGNOSIS — M5136 Other intervertebral disc degeneration, lumbar region: Secondary | ICD-10-CM | POA: Diagnosis not present

## 2021-05-03 DIAGNOSIS — R7303 Prediabetes: Secondary | ICD-10-CM | POA: Diagnosis not present

## 2021-05-03 DIAGNOSIS — Z96641 Presence of right artificial hip joint: Secondary | ICD-10-CM | POA: Diagnosis not present

## 2021-05-03 DIAGNOSIS — Z471 Aftercare following joint replacement surgery: Secondary | ICD-10-CM | POA: Diagnosis not present

## 2021-05-03 DIAGNOSIS — M1612 Unilateral primary osteoarthritis, left hip: Secondary | ICD-10-CM | POA: Diagnosis not present

## 2021-05-03 DIAGNOSIS — M1611 Unilateral primary osteoarthritis, right hip: Secondary | ICD-10-CM | POA: Diagnosis not present

## 2021-05-03 DIAGNOSIS — Z8546 Personal history of malignant neoplasm of prostate: Secondary | ICD-10-CM | POA: Diagnosis not present

## 2021-05-03 DIAGNOSIS — M48061 Spinal stenosis, lumbar region without neurogenic claudication: Secondary | ICD-10-CM | POA: Diagnosis not present

## 2021-05-03 DIAGNOSIS — J45909 Unspecified asthma, uncomplicated: Secondary | ICD-10-CM | POA: Diagnosis not present

## 2021-05-03 DIAGNOSIS — G8918 Other acute postprocedural pain: Secondary | ICD-10-CM | POA: Diagnosis not present

## 2021-05-03 DIAGNOSIS — E785 Hyperlipidemia, unspecified: Secondary | ICD-10-CM | POA: Diagnosis not present

## 2021-05-03 DIAGNOSIS — K219 Gastro-esophageal reflux disease without esophagitis: Secondary | ICD-10-CM | POA: Diagnosis not present

## 2021-05-04 DIAGNOSIS — K219 Gastro-esophageal reflux disease without esophagitis: Secondary | ICD-10-CM | POA: Diagnosis not present

## 2021-05-04 DIAGNOSIS — J45909 Unspecified asthma, uncomplicated: Secondary | ICD-10-CM | POA: Diagnosis not present

## 2021-05-04 DIAGNOSIS — I10 Essential (primary) hypertension: Secondary | ICD-10-CM | POA: Diagnosis not present

## 2021-05-04 DIAGNOSIS — M48061 Spinal stenosis, lumbar region without neurogenic claudication: Secondary | ICD-10-CM | POA: Diagnosis not present

## 2021-05-04 DIAGNOSIS — Z8546 Personal history of malignant neoplasm of prostate: Secondary | ICD-10-CM | POA: Diagnosis not present

## 2021-05-04 DIAGNOSIS — E785 Hyperlipidemia, unspecified: Secondary | ICD-10-CM | POA: Diagnosis not present

## 2021-05-04 DIAGNOSIS — R7303 Prediabetes: Secondary | ICD-10-CM | POA: Diagnosis not present

## 2021-05-04 DIAGNOSIS — M1611 Unilateral primary osteoarthritis, right hip: Secondary | ICD-10-CM | POA: Diagnosis not present

## 2021-05-04 DIAGNOSIS — M5136 Other intervertebral disc degeneration, lumbar region: Secondary | ICD-10-CM | POA: Diagnosis not present

## 2021-05-19 DIAGNOSIS — Z96641 Presence of right artificial hip joint: Secondary | ICD-10-CM | POA: Diagnosis not present

## 2021-05-19 DIAGNOSIS — Z471 Aftercare following joint replacement surgery: Secondary | ICD-10-CM | POA: Diagnosis not present

## 2021-06-02 ENCOUNTER — Other Ambulatory Visit: Payer: Self-pay | Admitting: Sports Medicine

## 2021-06-02 DIAGNOSIS — M1611 Unilateral primary osteoarthritis, right hip: Secondary | ICD-10-CM

## 2021-06-16 ENCOUNTER — Ambulatory Visit (INDEPENDENT_AMBULATORY_CARE_PROVIDER_SITE_OTHER): Payer: Medicare HMO | Admitting: Sports Medicine

## 2021-06-16 DIAGNOSIS — Z0000 Encounter for general adult medical examination without abnormal findings: Secondary | ICD-10-CM

## 2021-06-16 NOTE — Progress Notes (Signed)
? ? ?MEDICARE ANNUAL WELLNESS VISIT ? ?06/16/2021 ? ?Telephone Visit Disclaimer ?This Medicare AWV was conducted by telephone due to national recommendations for restrictions regarding the COVID-19 Pandemic (e.g. social distancing).  I verified, using two identifiers, that I am speaking with Michael Conrad or their authorized healthcare agent. I discussed the limitations, risks, security, and privacy concerns of performing an evaluation and management service by telephone and the potential availability of an in-person appointment in the future. The patient expressed understanding and agreed to proceed.  ?Location of Patient: Home ?Location of Provider (nurse):  In the office. ? ?Subjective:  ? ? ?Michael Conrad is a 76 y.o. male patient of Thekkekandam, Gwen Her, MD who had a Medicare Annual Wellness Visit today via telephone. Michael Conrad is Retired and lives alone. he has 3 children. he reports that he is socially active and does interact with friends/family regularly. he is minimally physically active and enjoys doing yard work. ? ?Patient Care Team: ?Silverio Decamp, MD as PCP - General (Family Medicine) ? ? ?  06/16/2021  ?  9:11 AM 01/05/2015  ? 11:09 AM 09/04/2014  ?  8:11 AM 08/05/2014  ?  9:05 AM  ?Advanced Directives  ?Does Patient Have a Medical Advance Directive? Yes Yes Yes Yes  ?Type of Advance Directive Living will;Healthcare Power of Attorney   Living will  ?Does patient want to make changes to medical advance directive? No - Patient declined     ?Copy of [place] in Chart? No - copy requested  Yes   ? ? ?Hospital Utilization Over the Past 12 Months: ?# of hospitalizations or ER visits: 0 ?# of surgeries: 1 ? ?Review of Systems    ?Patient reports that his overall health is better compared to last year. ? ?History obtained from chart review and the patient ? ?Patient Reported Readings (BP, Pulse, CBG, Weight, etc) ?none ? ?Pain Assessment ?Pain : No/denies pain ? ?   ? ?Current Medications & Allergies (verified) ?Allergies as of 06/16/2021   ? ?   Reactions  ? Dust Mite Extract Cough  ? Ambrosia Artemisiifolia (ragweed) Skin Test Cough  ? Other Cough  ? Pollen Extract Cough  ? ?  ? ?  ?Medication List  ?  ? ?  ? Accurate as of Jun 16, 2021  9:23 AM. If you have any questions, ask your nurse or doctor.  ?  ?  ? ?  ? ?aspirin 81 MG EC tablet ?Take 1 tablet '81mg'$  twice daily X 6 weeks ?  ?atorvastatin 40 MG tablet ?Commonly known as: LIPITOR ?TAKE 1 TABLET EVERY DAY AT 6 PM ?  ?Cholecalciferol 50 MCG (2000 UT) Caps ?Take 1 capsule by mouth 2 (two) times daily. ?  ?diclofenac Sodium 1 % Gel ?Commonly known as: VOLTAREN ?Apply topically 4 (four) times daily. ?  ?DULoxetine 30 MG capsule ?Commonly known as: Cymbalta ?Take 1 capsule (30 mg total) by mouth 2 (two) times daily. ?  ?ibuprofen 800 MG tablet ?Commonly known as: ADVIL ?one tablet (800 mg dose). ?  ?irbesartan-hydrochlorothiazide 150-12.5 MG tablet ?Commonly known as: AVALIDE ?TAKE 1/2 TABLET EVERY DAY ?  ?omeprazole 40 MG capsule ?Commonly known as: PRILOSEC ?Take 1 capsule (40 mg total) by mouth 2 (two) times daily. ?  ?Oxcarbazepine 300 MG tablet ?Commonly known as: TRILEPTAL ?Take 300 mg by mouth 2 (two) times daily. ?  ?traMADol 200 MG 24 hr tablet ?Commonly known as: ULTRAM-ER ?Take 1 tablet (200 mg total) by mouth  daily. ?  ?traMADol 50 MG tablet ?Commonly known as: ULTRAM ?TAKE 1 TO 2 TABLETS EVERY 8 HOURS,  MAX  6  TABLETS  PER  DAY ?  ?traZODone 150 MG tablet ?Commonly known as: DESYREL ?TAKE 1 TABLET (150 MG TOTAL) BY MOUTH AT BEDTIME. ?  ? ?  ? ? ?History (reviewed): ?Past Medical History:  ?Diagnosis Date  ? Hyperlipidemia   ? Hypertension   ? ?Past Surgical History:  ?Procedure Laterality Date  ? REVISION TOTAL HIP ARTHROPLASTY    ? ROTATOR CUFF REPAIR    ? TOTAL HIP ARTHROPLASTY Right 04/2021  ? [facility] [place], [place]  ? ?Family History  ?Problem Relation Age of Onset  ? Heart disease Mother   ? Heart disease  Father   ? ?Social History  ? ?Socioeconomic History  ? Marital status: Divorced  ?  Spouse name: Not on file  ? Number of children: 3  ? Years of education: 64  ? Highest education level: 12th grade  ?Occupational History  ? Occupation: Retired  ?Tobacco Use  ? Smoking status: Never  ? Smokeless tobacco: Never  ?Substance and Sexual Activity  ? Alcohol use: Yes  ?  Alcohol/week: 5.0 standard drinks  ?  Types: 5 Standard drinks or equivalent per week  ? Drug use: No  ? Sexual activity: Not Currently  ?Other Topics Concern  ? Not on file  ?Social History Narrative  ? Lives alone with his two cats. He has three children. He enjoys doing yard work when he can.  ? ?Social Determinants of Health  ? ?Financial Resource Strain: Low Risk   ? Difficulty of Paying Living Expenses: Not hard at all  ?Food Insecurity: No Food Insecurity  ? Worried About Charity fundraiser in the Last Year: Never true  ? Ran Out of Food in the Last Year: Never true  ?Transportation Needs: No Transportation Needs  ? Lack of Transportation (Medical): No  ? Lack of Transportation (Non-Medical): No  ?Physical Activity: Inactive  ? Days of Exercise per Week: 0 days  ? Minutes of Exercise per Session: 0 min  ?Stress: No Stress Concern Present  ? Feeling of Stress : Not at all  ?Social Connections: Socially Isolated  ? Frequency of Communication with Friends and Family: Once a week  ? Frequency of Social Gatherings with Friends and Family: Once a week  ? Attends Religious Services: Never  ? Active Member of Clubs or Organizations: No  ? Attends Archivist Meetings: Never  ? Marital Status: Divorced  ? ? ?Activities of Daily Living ? ?  06/16/2021  ?  9:15 AM  ?In your present state of health, do you have any difficulty performing the following activities:  ?Hearing? 0  ?Vision? 0  ?Difficulty concentrating or making decisions? 0  ?Walking or climbing stairs? 0  ?Dressing or bathing? 0  ?Doing errands, shopping? 0  ?Preparing Food and eating ?  N  ?Using the Toilet? N  ?In the past six months, have you accidently leaked urine? N  ?Do you have problems with loss of bowel control? N  ?Managing your Medications? N  ?Managing your Finances? N  ?Housekeeping or managing your Housekeeping? N  ? ? ?Patient Education/ Literacy ?How often do you need to have someone help you when you read instructions, pamphlets, or other written materials from your doctor or pharmacy?: 1 - Never ?What is the last grade level you completed in school?: 12th grade ? ?Exercise ?Current Exercise Habits:  The patient does not participate in regular exercise at present, Exercise limited by: orthopedic condition(s) ? ?Diet ?Patient reports consuming 2 meals a day and 1 snack(s) a day ?Patient reports that his primary diet is: Regular ?Patient reports that she does have regular access to food.  ? ?Depression Screen ? ?  06/16/2021  ?  9:11 AM 05/05/2020  ? 11:43 AM 05/05/2020  ? 11:34 AM 11/02/2016  ? 10:18 AM  ?PHQ 2/9 Scores  ?PHQ - 2 Score 0 2 2 0  ?Exception Documentation   Medical reason   ?Not completed   Patient did not want to discuss any further, we are starting an antidepressant and will do PHQ-9 at the follow-up visit.   ?  ? ?Fall Risk ? ?  06/16/2021  ?  9:11 AM 05/05/2020  ? 11:43 AM 05/05/2020  ? 11:35 AM 11/02/2016  ? 10:18 AM  ?Fall Risk   ?Falls in the past year? 0 0 0 No  ?Number falls in past yr: 0  0   ?Injury with Fall? 0  0   ?Risk for fall due to : No Fall Risks     ?Follow up Falls evaluation completed     ? ?  ?Objective:  ?Michael Conrad seemed alert and oriented and he participated appropriately during our telephone visit. ? ?Blood Pressure Weight BMI  ?BP Readings from Last 3 Encounters:  ?10/08/20 (!) 124/91  ?05/05/20 136/83  ?10/22/19 129/86  ? Wt Readings from Last 3 Encounters:  ?11/16/20 217 lb (98.4 kg)  ?05/05/20 231 lb (104.8 kg)  ?10/22/19 226 lb (102.5 kg)  ? BMI Readings from Last 1 Encounters:  ?11/16/20 29.43 kg/m?  ?  ?*Unable to obtain current vital  signs, weight, and BMI due to telephone visit type ? ?Hearing/Vision  ?Jerren did not seem to have difficulty with hearing/understanding during the telephone conversation ?Reports that he has not had a formal eye e

## 2021-06-16 NOTE — Patient Instructions (Addendum)
?MEDICARE ANNUAL WELLNESS VISIT ?Health Maintenance Summary and Written Plan of Care ? ?[name] , ? ?Thank you for allowing me to perform your Medicare Annual Wellness Visit and for your ongoing commitment to your health.  ? ?Health Maintenance & Immunization History ?Health Maintenance  ?Topic Date Due  ? TETANUS/TDAP  08/30/2021  ? Fecal DNA (Cologuard)  05/16/2023  ? Pneumonia Vaccine 73+ Years old  Completed  ? Hepatitis C Screening  Completed  ? HPV VACCINES  Aged Out  ? INFLUENZA VACCINE  Discontinued  ? COLONOSCOPY (Pts 45-76yr Insurance coverage will need to be confirmed)  Discontinued  ? COVID-19 Vaccine  Discontinued  ? Zoster Vaccines- Shingrix  Discontinued  ? ?Immunization History  ?Administered Date(s) Administered  ? Fluad Quad(high Dose 65+) 11/05/2018, 11/22/2019, 11/16/2020  ? Influenza Split 11/02/2016  ? Influenza, High Dose Seasonal PF 10/20/2017, 11/18/2019  ? Influenza,inj,Quad PF,6+ Mos 11/26/2014, 10/22/2015, 11/02/2016, 10/20/2017  ? Influenza-Unspecified 02/07/2010, 11/01/2010, 10/17/2011, 11/01/2012, 11/25/2013  ? Moderna Sars-Covid-2 Vaccination 05/12/2019, 06/11/2019, 01/10/2020, 05/27/2020  ? Pneumococcal Conjugate-13 11/25/2013, 11/02/2016  ? Pneumococcal Polysaccharide-23 11/05/2018  ? Tdap 02/07/2010, 08/31/2011  ? ? ?These are the patient goals that we discussed: ? Goals Addressed   ? ?  ?  ?  ?  ?  ? This Visit's Progress  ?   Patient Stated (pt-stated)     ?   "Continue to stay healthy and see another year". ?  ? ?  ?  ? ?This is a list of Health Maintenance Items that are overdue or due now: ?Td vaccine - Due in July, 2023. ? ?Orders/Referrals Placed Today: ?No orders of the defined types were placed in this encounter. ? ?(Contact our referral department at [phone] you have not spoken with someone about your referral appointment within the next 5 days)  ? ? ?Follow-up Plan ?Follow-up with TSilverio Decamp MD as planned ?Medicare wellness visit in one year.   ?AVS printed and mailed to the patient. ? ? ?  ?Health Maintenance, Male ?Adopting a healthy lifestyle and getting preventive care are important in promoting health and wellness. Ask your health care provider about: ?The right schedule for you to have regular tests and exams. ?Things you can do on your own to prevent diseases and keep yourself healthy. ?What should I know about diet, weight, and exercise? ?Eat a healthy diet ? ?Eat a diet that includes plenty of vegetables, fruits, low-fat dairy products, and lean protein. ?Do not eat a lot of foods that are high in solid fats, added sugars, or sodium. ?Maintain a healthy weight ?Body mass index (BMI) is a measurement that can be used to identify possible weight problems. It estimates body fat based on height and weight. Your health care provider can help determine your BMI and help you achieve or maintain a healthy weight. ?Get regular exercise ?Get regular exercise. This is one of the most important things you can do for your health. Most adults should: ?Exercise for at least 150 minutes each week. The exercise should increase your heart rate and make you sweat (moderate-intensity exercise). ?Do strengthening exercises at least twice a week. This is in addition to the moderate-intensity exercise. ?Spend less time sitting. Even light physical activity can be beneficial. ?Watch cholesterol and blood lipids ?Have your blood tested for lipids and cholesterol at 75years of age, then have this test every 5 years. ?You may need to have your cholesterol levels checked more often if: ?Your lipid or cholesterol levels are high. ?  You are older than 75 years of age. ?You are at high risk for heart disease. ?What should I know about cancer screening? ?Many types of cancers can be detected early and may often be prevented. Depending on your health history and family history, you may need to have cancer screening at various ages. This may include screening for: ?Colorectal  cancer. ?Prostate cancer. ?Skin cancer. ?Lung cancer. ?What should I know about heart disease, diabetes, and high blood pressure? ?Blood pressure and heart disease ?High blood pressure causes heart disease and increases the risk of stroke. This is more likely to develop in people who have high blood pressure readings or are overweight. ?Talk with your health care provider about your target blood pressure readings. ?Have your blood pressure checked: ?Every 3-5 years if you are 58-42 years of age. ?Every year if you are 30 years old or older. ?If you are between the ages of 24 and 28 and are a current or former smoker, ask your health care provider if you should have a one-time screening for abdominal aortic aneurysm (AAA). ?Diabetes ?Have regular diabetes screenings. This checks your fasting blood sugar level. Have the screening done: ?Once every three years after age 75 if you are at a normal weight and have a low risk for diabetes. ?More often and at a younger age if you are overweight or have a high risk for diabetes. ?What should I know about preventing infection? ?Hepatitis B ?If you have a higher risk for hepatitis B, you should be screened for this virus. Talk with your health care provider to find out if you are at risk for hepatitis B infection. ?Hepatitis C ?Blood testing is recommended for: ?Everyone born from [birth year] through 1964. ?Anyone with known risk factors for hepatitis C. ?Sexually transmitted infections (STIs) ?You should be screened each year for STIs, including gonorrhea and chlamydia, if: ?You are sexually active and are younger than 75 years of age. ?You are older than 75 years of age and your health care provider tells you that you are at risk for this type of infection. ?Your sexual activity has changed since you were last screened, and you are at increased risk for chlamydia or gonorrhea. Ask your health care provider if you are at risk. ?Ask your health care provider about whether you are at  high risk for HIV. Your health care provider may recommend a prescription medicine to help prevent HIV infection. If you choose to take medicine to prevent HIV, you should first get tested for HIV. You should then be tested every 3 months for as long as you are taking the medicine. ?Follow these instructions at home: ?Alcohol use ?Do not drink alcohol if your health care provider tells you not to drink. ?If you drink alcohol: ?Limit how much you have to 0-2 drinks a day. ?Know how much alcohol is in your drink. In the [place] one drink equals one 12 oz bottle of beer (355 mL), one 5 oz glass of wine (148 mL), or one 1? oz glass of hard liquor (44 mL). ?Lifestyle ?Do not use any products that contain nicotine or tobacco. These products include cigarettes, chewing tobacco, and vaping devices, such as e-cigarettes. If you need help quitting, ask your health care provider. ?Do not use street drugs. ?Do not share needles. ?Ask your health care provider for help if you need support or information about quitting drugs. ?General instructions ?Schedule regular health, dental, and eye exams. ?Stay current with your vaccines. ?Tell your health  care provider if: ?You often feel depressed. ?You have ever been abused or do not feel safe at home. ?Summary ?Adopting a healthy lifestyle and getting preventive care are important in promoting health and wellness. ?Follow your health care provider's instructions about healthy diet, exercising, and getting tested or screened for diseases. ?Follow your health care provider's instructions on monitoring your cholesterol and blood pressure. ?This information is not intended to replace advice given to you by your health care provider. Make sure you discuss any questions you have with your health care provider. ?Document Revised: 06/15/2020 Document Reviewed: 06/15/2020 ?Elsevier Patient Education ? [place]. ? ?

## 2021-06-24 ENCOUNTER — Encounter: Payer: Self-pay | Admitting: Sports Medicine

## 2021-06-24 ENCOUNTER — Ambulatory Visit (INDEPENDENT_AMBULATORY_CARE_PROVIDER_SITE_OTHER): Payer: Medicare HMO | Admitting: Sports Medicine

## 2021-06-24 DIAGNOSIS — F5101 Primary insomnia: Secondary | ICD-10-CM

## 2021-06-24 DIAGNOSIS — G4700 Insomnia, unspecified: Secondary | ICD-10-CM | POA: Insufficient documentation

## 2021-06-24 DIAGNOSIS — Z96641 Presence of right artificial hip joint: Secondary | ICD-10-CM

## 2021-06-24 DIAGNOSIS — M1611 Unilateral primary osteoarthritis, right hip: Secondary | ICD-10-CM

## 2021-06-24 MED ORDER — TRAMADOL HCL 50 MG PO TABS: ORAL_TABLET | ORAL | 0 refills | Status: DC

## 2021-06-24 MED ORDER — MELATONIN 3 MG PO TABS: ORAL_TABLET | ORAL | 3 refills | Status: DC

## 2021-06-24 MED ORDER — ZOLPIDEM TARTRATE 5 MG PO TABS: 5.0000 mg | ORAL_TABLET | Freq: Every evening | ORAL | 1 refills | Status: DC | PRN

## 2021-06-24 NOTE — Assessment & Plan Note (Signed)
Right hip arthroplasty 2 months ago, postoperative pain control beyond tramadol needs to come from his orthopedist.

## 2021-06-24 NOTE — Progress Notes (Signed)
    Procedures performed today:    None.  Independent interpretation of notes and tests performed by another provider:   None.  Brief History, Exam, Impression, and Recommendations:    Insomnia Pleasant 76 year old male, history of right total hip arthroplasty about 2 months ago, unfortunately continues to have significant pain that keeps him from sleeping. He did tell me his orthopedic surgeon had suggested that his primary care provider needs to control his pain, unfortunately the surgeon will need to control his postoperative pain. In the meantime we discussed some sleep hygiene modalities such as avoiding bright lights before bedtime, we will do 3 mg of melatonin at about 8 PM, and a low-dose Ambien to use if he is unable to sleep still. If he is able to get some stronger pain medication from his orthopedic surgeon he will hold off on the Ambien.  History of total right hip arthroplasty Right hip arthroplasty 2 months ago, postoperative pain control beyond tramadol needs to come from his orthopedist.  Chronic process with exacerbation of pharmacologic intervention  ___________________________________________ Gwen Her. Dianah Field, M.D., ABFM., CAQSM. Primary Care and [place] Instructor of [place] of [facility] of Medicine

## 2021-06-24 NOTE — Assessment & Plan Note (Addendum)
Pleasant 76 year old male, history of right total hip arthroplasty about 2 months ago, unfortunately continues to have significant pain that keeps him from sleeping. He did tell me his orthopedic surgeon had suggested that his primary care provider needs to control his pain, unfortunately the surgeon will need to control his postoperative pain. In the meantime we discussed some sleep hygiene modalities such as avoiding bright lights before bedtime, we will do 3 mg of melatonin at about 8 PM, and a low-dose Ambien to use if he is unable to sleep still. If he is able to get some stronger pain medication from his orthopedic surgeon he will hold off on the Ambien.

## 2021-07-22 DIAGNOSIS — Z471 Aftercare following joint replacement surgery: Secondary | ICD-10-CM | POA: Diagnosis not present

## 2021-07-22 DIAGNOSIS — Z96641 Presence of right artificial hip joint: Secondary | ICD-10-CM | POA: Diagnosis not present

## 2021-08-12 ENCOUNTER — Encounter: Payer: Self-pay | Admitting: Sports Medicine

## 2021-08-12 ENCOUNTER — Ambulatory Visit (INDEPENDENT_AMBULATORY_CARE_PROVIDER_SITE_OTHER): Payer: Medicare HMO | Admitting: Sports Medicine

## 2021-08-12 ENCOUNTER — Ambulatory Visit (INDEPENDENT_AMBULATORY_CARE_PROVIDER_SITE_OTHER): Payer: Medicare HMO

## 2021-08-12 DIAGNOSIS — M1711 Unilateral primary osteoarthritis, right knee: Secondary | ICD-10-CM

## 2021-08-12 MED ORDER — IBUPROFEN 800 MG PO TABS: 800.0000 mg | ORAL_TABLET | Freq: Two times a day (BID) | ORAL | 3 refills | Status: AC | PRN

## 2021-08-12 NOTE — Assessment & Plan Note (Signed)
[name] returns, he is a pleasant 76 year old male, known knee arthritis, last injected February of this year, recurrence of pain, repeat knee injection today, he will continue tramadol, with ibuprofen twice daily. Return to see me as needed.

## 2021-08-12 NOTE — Progress Notes (Signed)
    Procedures performed today:    Procedure: Real-time Ultrasound Guided injection of the right knee Device: Samsung HS60  Verbal informed consent obtained.  Time-out conducted.  Noted no overlying erythema, induration, or other signs of local infection.  Skin prepped in a sterile fashion.  Local anesthesia: Topical Ethyl chloride.  With sterile technique and under real time ultrasound guidance: No effusion noted, 1 cc Kenalog 40, 2 cc lidocaine, 2 cc bupivacaine injected easily Completed without difficulty  Advised to call if fevers/chills, erythema, induration, drainage, or persistent bleeding.  Images permanently stored and available for review in PACS.  Impression: Technically successful ultrasound guided injection.  Independent interpretation of notes and tests performed by another provider:   None.  Brief History, Exam, Impression, and Recommendations:    Primary osteoarthritis of right knee Michael Conrad returns, he is a pleasant 76 year old male, known knee arthritis, last injected February of this year, recurrence of pain, repeat knee injection today, he will continue tramadol, with ibuprofen twice daily. Return to see me as needed.    ____________________________________________ Gwen Her. Dianah Field, M.D., ABFM., CAQSM., AME. Primary Care and Sports Medicine [facility]  Adjunct Professor of [place] of [facility] [employer] of Medicine  Risk manager

## 2021-10-08 DIAGNOSIS — H52209 Unspecified astigmatism, unspecified eye: Secondary | ICD-10-CM | POA: Diagnosis not present

## 2021-10-08 DIAGNOSIS — H2513 Age-related nuclear cataract, bilateral: Secondary | ICD-10-CM | POA: Diagnosis not present

## 2021-10-08 DIAGNOSIS — H524 Presbyopia: Secondary | ICD-10-CM | POA: Diagnosis not present

## 2021-10-08 DIAGNOSIS — H5203 Hypermetropia, bilateral: Secondary | ICD-10-CM | POA: Diagnosis not present

## 2021-10-14 ENCOUNTER — Other Ambulatory Visit: Payer: Self-pay | Admitting: Sports Medicine

## 2021-10-14 DIAGNOSIS — I10 Essential (primary) hypertension: Secondary | ICD-10-CM

## 2021-10-14 DIAGNOSIS — M1611 Unilateral primary osteoarthritis, right hip: Secondary | ICD-10-CM

## 2021-10-14 DIAGNOSIS — Z96641 Presence of right artificial hip joint: Secondary | ICD-10-CM

## 2021-10-25 ENCOUNTER — Ambulatory Visit (INDEPENDENT_AMBULATORY_CARE_PROVIDER_SITE_OTHER): Payer: Medicare HMO | Admitting: Sports Medicine

## 2021-10-25 ENCOUNTER — Encounter: Payer: Self-pay | Admitting: Sports Medicine

## 2021-10-25 VITALS — BP 130/79 | HR 67 | Ht 72.0 in | Wt 201.0 lb

## 2021-10-25 DIAGNOSIS — C61 Malignant neoplasm of prostate: Secondary | ICD-10-CM | POA: Diagnosis not present

## 2021-10-25 DIAGNOSIS — I10 Essential (primary) hypertension: Secondary | ICD-10-CM | POA: Diagnosis not present

## 2021-10-25 DIAGNOSIS — Z0000 Encounter for general adult medical examination without abnormal findings: Secondary | ICD-10-CM | POA: Diagnosis not present

## 2021-10-25 DIAGNOSIS — Z23 Encounter for immunization: Secondary | ICD-10-CM | POA: Diagnosis not present

## 2021-10-25 MED ORDER — IRBESARTAN-HYDROCHLOROTHIAZIDE 150-12.5 MG PO TABS: 0.5000 | ORAL_TABLET | Freq: Every day | ORAL | 3 refills | Status: DC

## 2021-10-25 NOTE — Progress Notes (Addendum)
 Subjective:    CC: Annual Physical Exam  HPI:  This patient is here for their annual physical  I reviewed the past medical history, family history, social history, surgical history, and allergies today and no changes were needed.  Please see the problem list section below in epic for further details.  Past Medical History: Past Medical History:  Diagnosis Date   Hyperlipidemia    Hypertension    Past Surgical History: Past Surgical History:  Procedure Laterality Date   REVISION TOTAL HIP ARTHROPLASTY     ROTATOR CUFF REPAIR     TOTAL HIP ARTHROPLASTY Right 04/2021   [facility] [place], [place]   Social History: Social History   Socioeconomic History   Marital status: Divorced    Spouse name: Not on file   Number of children: 3   Years of education: 12   Highest education level: 12th grade  Occupational History   Occupation: Retired  Tobacco Use   Smoking status: Never   Smokeless tobacco: Never  Substance and Sexual Activity   Alcohol use: Yes    Alcohol/week: 5.0 standard drinks of alcohol    Types: 5 Standard drinks or equivalent per week   Drug use: No   Sexual activity: Not Currently  Other Topics Concern   Not on file  Social History Narrative   Lives alone with his two cats. He has three children. He enjoys doing yard work when he can.   Social Determinants of Health   Financial Resource Strain: Low Risk  (06/16/2021)   Overall Financial Resource Strain (CARDIA)    Difficulty of Paying Living Expenses: Not hard at all  Food Insecurity: No Food Insecurity (06/16/2021)   Hunger Vital Sign    Worried About Running Out of Food in the Last Year: Never true    Ran Out of Food in the Last Year: Never true  Transportation Needs: No Transportation Needs (06/16/2021)   PRAPARE - Administrator, Civil Service (Medical): No    Lack of Transportation (Non-Medical): No  Physical Activity: Inactive (06/16/2021)   Exercise Vital Sign    Days of Exercise per  Week: 0 days    Minutes of Exercise per Session: 0 min  Stress: No Stress Concern Present (06/16/2021)   [employer] of Occupational Health - Occupational Stress Questionnaire    Feeling of Stress : Not at all  Social Connections: Socially Isolated (06/16/2021)   Social Connection and Isolation Panel [NHANES]    Frequency of Communication with Friends and Family: Once a week    Frequency of Social Gatherings with Friends and Family: Once a week    Attends Religious Services: Never    Database Administrator or Organizations: No    Attends Engineer, Structural: Never    Marital Status: Divorced   Family History: Family History  Problem Relation Age of Onset   Heart disease Mother    Heart disease Father    Allergies: Allergies  Allergen Reactions   Dust Mite Extract Cough   Ambrosia Artemisiifolia (Ragweed) Skin Test Cough   Other Cough   Pollen Extract Cough   Medications: See med rec.  Review of Systems: No headache, visual changes, nausea, vomiting, diarrhea, constipation, dizziness, abdominal pain, skin rash, fevers, chills, night sweats, swollen lymph nodes, weight loss, chest pain, body aches, joint swelling, muscle aches, shortness of breath, mood changes, visual or auditory hallucinations.  Objective:    General: Well Developed, well nourished, and in no acute distress.  Neuro: Alert and oriented x3, extra-ocular muscles intact, sensation grossly intact. Cranial nerves II through XII are intact, motor, sensory, and coordinative functions are all intact. HEENT: Normocephalic, atraumatic, pupils equal round reactive to light, neck supple, no masses, no lymphadenopathy, thyroid  nonpalpable. Oropharynx, nasopharynx, external ear canals are unremarkable. Skin: Warm and dry, no rashes noted.  Cardiac: Regular rate and rhythm, no murmurs rubs or gallops.  Respiratory: Clear to auscultation bilaterally. Not using accessory muscles, speaking in full sentences.   Abdominal: Soft, nontender, nondistended, positive bowel sounds, no masses, no organomegaly.  Musculoskeletal: Shoulder, elbow, wrist, hip, knee, ankle stable, and with full range of motion.  Impression and Recommendations:    The patient was counselled, risk factors were discussed, anticipatory guidance given.  Annual physical exam Pleasant 76 year old male, here for his physical, getting some screening labs, flu shot today, declines Tdap and Shingrix. Up-to-date on other screenings.  Prostate cancer Patient informed about rising PSA, his response is as follows:  Patient informed of results. Patient states that he thanks you for the results  of his PSA but feels he is going to just let this run it's course - he state he does not want to be cut up  anymore. He will not be discussing with his urologist.   We will respect the patient's wishes here.  ____________________________________________ Debby PARAS. Curtis, M.D., ABFM., CAQSM., AME. Primary Care and Sports Medicine [facility]  Adjunct Professor of [facility] Medicine  [employer]  Restaurant Manager, Fast Food

## 2021-10-25 NOTE — Assessment & Plan Note (Signed)
Pleasant 76 year old male, here for his physical, getting some screening labs, flu shot today, declines Tdap and Shingrix. Up-to-date on other screenings.

## 2021-10-27 LAB — CBC
HCT: 43 % (ref 38.5–50.0)
Hemoglobin: 14.5 g/dL (ref 13.2–17.1)
MCH: 29.7 pg (ref 27.0–33.0)
MCHC: 33.7 g/dL (ref 32.0–36.0)
MCV: 88.1 fL (ref 80.0–100.0)
MPV: 10.5 fL (ref 7.5–12.5)
Platelets: 277 10*3/uL (ref 140–400)
RBC: 4.88 10*6/uL (ref 4.20–5.80)
RDW: 13.1 % (ref 11.0–15.0)
WBC: 6.6 10*3/uL (ref 3.8–10.8)

## 2021-10-27 LAB — LIPID PANEL
Cholesterol: 154 mg/dL (ref <200)
HDL: 45 mg/dL (ref >=40)
LDL Cholesterol (Calc): 81 mg/dL (calc)
Non-HDL Cholesterol (Calc): 109 mg/dL (calc) (ref <130)
Total CHOL/HDL Ratio: 3.4 (calc) (ref <5.0)
Triglycerides: 183 mg/dL — ABNORMAL HIGH (ref <150)

## 2021-10-27 LAB — COMPLETE METABOLIC PANEL WITH GFR
AG Ratio: 2.2 (calc) (ref 1.0–2.5)
ALT: 11 U/L (ref 9–46)
AST: 15 U/L (ref 10–35)
Albumin: 4.6 g/dL (ref 3.6–5.1)
Alkaline phosphatase (APISO): 58 U/L (ref 35–144)
BUN: 17 mg/dL (ref 7–25)
CO2: 30 mmol/L (ref 20–32)
Calcium: 9.6 mg/dL (ref 8.6–10.3)
Chloride: 101 mmol/L (ref 98–110)
Creat: 0.98 mg/dL (ref 0.70–1.28)
Globulin: 2.1 g/dL (calc) (ref 1.9–3.7)
Glucose, Bld: 99 mg/dL (ref 65–99)
Potassium: 3.8 mmol/L (ref 3.5–5.3)
Sodium: 138 mmol/L (ref 135–146)
Total Bilirubin: 0.9 mg/dL (ref 0.2–1.2)
Total Protein: 6.7 g/dL (ref 6.1–8.1)
eGFR: 80 mL/min/{1.73_m2} (ref >=60)

## 2021-10-27 LAB — TSH: TSH: 1.65 mIU/L (ref 0.40–4.50)

## 2021-10-27 LAB — PSA, TOTAL AND FREE
PSA, Free: 1.1 ng/mL
PSA, Total: 10.3 ng/mL — ABNORMAL HIGH (ref <=4.0)

## 2021-10-28 NOTE — Assessment & Plan Note (Signed)
Patient informed about rising PSA, his response is as follows:  "Patient informed of results. Patient states that he thanks you for the results of his PSA but feels he is going to just let this "run it's course" - he state he does not want to be "cut up " anymore. He will not be discussing with his urologist. "  We will respect the patient's wishes here.

## 2021-11-23 DIAGNOSIS — Z471 Aftercare following joint replacement surgery: Secondary | ICD-10-CM | POA: Diagnosis not present

## 2021-11-23 DIAGNOSIS — R202 Paresthesia of skin: Secondary | ICD-10-CM | POA: Diagnosis not present

## 2021-11-23 DIAGNOSIS — Z96641 Presence of right artificial hip joint: Secondary | ICD-10-CM | POA: Diagnosis not present

## 2021-11-23 DIAGNOSIS — R200 Anesthesia of skin: Secondary | ICD-10-CM | POA: Diagnosis not present

## 2021-12-06 DIAGNOSIS — M48062 Spinal stenosis, lumbar region with neurogenic claudication: Secondary | ICD-10-CM | POA: Diagnosis not present

## 2021-12-06 DIAGNOSIS — M47816 Spondylosis without myelopathy or radiculopathy, lumbar region: Secondary | ICD-10-CM | POA: Diagnosis not present

## 2021-12-06 DIAGNOSIS — M25551 Pain in right hip: Secondary | ICD-10-CM | POA: Diagnosis not present

## 2022-01-10 DIAGNOSIS — M47816 Spondylosis without myelopathy or radiculopathy, lumbar region: Secondary | ICD-10-CM | POA: Diagnosis not present

## 2022-01-14 ENCOUNTER — Ambulatory Visit: Payer: Medicare HMO | Admitting: Sports Medicine

## 2022-01-18 ENCOUNTER — Ambulatory Visit (INDEPENDENT_AMBULATORY_CARE_PROVIDER_SITE_OTHER): Payer: Medicare HMO

## 2022-01-18 ENCOUNTER — Ambulatory Visit (INDEPENDENT_AMBULATORY_CARE_PROVIDER_SITE_OTHER): Payer: Medicare HMO | Admitting: Sports Medicine

## 2022-01-18 DIAGNOSIS — M25532 Pain in left wrist: Secondary | ICD-10-CM

## 2022-01-18 DIAGNOSIS — M25531 Pain in right wrist: Secondary | ICD-10-CM | POA: Diagnosis not present

## 2022-01-18 DIAGNOSIS — M180 Bilateral primary osteoarthritis of first carpometacarpal joints: Secondary | ICD-10-CM | POA: Insufficient documentation

## 2022-01-18 DIAGNOSIS — M19032 Primary osteoarthritis, left wrist: Secondary | ICD-10-CM | POA: Diagnosis not present

## 2022-01-18 DIAGNOSIS — M19031 Primary osteoarthritis, right wrist: Secondary | ICD-10-CM | POA: Diagnosis not present

## 2022-01-18 NOTE — Assessment & Plan Note (Signed)
This is a very pleasant 76 year old male, has had several weeks of increasing pain bilateral wrists localized dorsal radiocarpal joint, no obvious numbness or tingling to the hand or fingertips. He does pretty well with his ibuprofen and occasional tramadol. On exam he has a bit of pain with terminal flexion and extension of the wrists left worse than right, does not have a whole lot of tenderness at the first [facility] bilaterally. Negative Tinel's and negative Phalen signs bilaterally. Suspect radiocarpal osteoarthritis with a wrist joint pain generator, adding bilateral x-rays, he will continue ibuprofen 800 mg 3 times daily, topical Voltaren 4 times daily, adding wrist home physical therapy, return to see me in 6 weeks, bilateral radiocarpal joint injections if not better.

## 2022-01-18 NOTE — Progress Notes (Signed)
    Procedures performed today:    None.  Independent interpretation of notes and tests performed by another provider:   None.  Brief History, Exam, Impression, and Recommendations:    Bilateral wrist pain This is a very pleasant 76 year old male, has had several weeks of increasing pain bilateral wrists localized dorsal radiocarpal joint, no obvious numbness or tingling to the hand or fingertips. He does pretty well with his ibuprofen and occasional tramadol. On exam he has a bit of pain with terminal flexion and extension of the wrists left worse than right, does not have a whole lot of tenderness at the first [facility] bilaterally. Negative Tinel's and negative Phalen signs bilaterally. Suspect radiocarpal osteoarthritis with a wrist joint pain generator, adding bilateral x-rays, he will continue ibuprofen 800 mg 3 times daily, topical Voltaren 4 times daily, adding wrist home physical therapy, return to see me in 6 weeks, bilateral radiocarpal joint injections if not better.    ____________________________________________ Gwen Her. Dianah Field, M.D., ABFM., CAQSM., AME. Primary Care and Sports Medicine [facility]  Adjunct Professor of [place] of [facility] of Medicine  Risk manager

## 2022-01-28 ENCOUNTER — Other Ambulatory Visit: Payer: Self-pay | Admitting: Sports Medicine

## 2022-01-28 DIAGNOSIS — M1611 Unilateral primary osteoarthritis, right hip: Secondary | ICD-10-CM

## 2022-01-28 DIAGNOSIS — Z96641 Presence of right artificial hip joint: Secondary | ICD-10-CM

## 2022-02-01 ENCOUNTER — Other Ambulatory Visit: Payer: Self-pay | Admitting: Sports Medicine

## 2022-02-02 ENCOUNTER — Other Ambulatory Visit: Payer: Self-pay | Admitting: Sports Medicine

## 2022-03-01 ENCOUNTER — Ambulatory Visit: Payer: Medicare HMO | Admitting: Sports Medicine

## 2022-03-22 ENCOUNTER — Ambulatory Visit (INDEPENDENT_AMBULATORY_CARE_PROVIDER_SITE_OTHER): Payer: Medicare PPO

## 2022-03-22 ENCOUNTER — Ambulatory Visit (INDEPENDENT_AMBULATORY_CARE_PROVIDER_SITE_OTHER): Payer: Medicare PPO | Admitting: Sports Medicine

## 2022-03-22 DIAGNOSIS — G6289 Other specified polyneuropathies: Secondary | ICD-10-CM

## 2022-03-22 DIAGNOSIS — M180 Bilateral primary osteoarthritis of first carpometacarpal joints: Secondary | ICD-10-CM

## 2022-03-22 MED ORDER — GABAPENTIN 300 MG PO CAPS: ORAL_CAPSULE | ORAL | 3 refills | Status: DC

## 2022-03-22 MED ORDER — DICLOFENAC SODIUM 1 % EX GEL: 4.0000 g | Freq: Four times a day (QID) | CUTANEOUS | 11 refills | Status: DC

## 2022-03-22 MED ORDER — TRIAMCINOLONE ACETONIDE 40 MG/ML IJ SUSP
80.0000 mg | Freq: Once | INTRAMUSCULAR | Status: AC
  Administered 2022-03-22: 80 mg via INTRAMUSCULAR

## 2022-03-22 NOTE — Addendum Note (Signed)
Addended by: Silverio Decamp on: 03/22/2022 11:31 AM   Modules accepted: Orders

## 2022-03-22 NOTE — Progress Notes (Addendum)
    Procedures performed today:    Procedure: Real-time Ultrasound Guided injection of the Left first [facility] Device: Samsung HS60  Verbal informed consent obtained.  Time-out conducted.  Noted no overlying erythema, induration, or other signs of local infection.  Skin prepped in a sterile fashion.  Local anesthesia: Topical Ethyl chloride.  With sterile technique and under real time ultrasound guidance: Noted arthritic CMC, 1/2 cc lidocaine, 1/2 cc kenalog 40 injected easily. Completed without difficulty  Advised to call if fevers/chills, erythema, induration, drainage, or persistent bleeding.  Images permanently stored and available for review in PACS.  Impression: Technically successful ultrasound guided injection.  Procedure: Real-time Ultrasound Guided injection of the right first [facility] Device: Samsung HS60  Verbal informed consent obtained.  Time-out conducted.  Noted no overlying erythema, induration, or other signs of local infection.  Skin prepped in a sterile fashion.  Local anesthesia: Topical Ethyl chloride.  With sterile technique and under real time ultrasound guidance: Noted arthritic CMC, 1/2 cc lidocaine, 1/2 cc kenalog 40 injected easily. Completed without difficulty  Advised to call if fevers/chills, erythema, induration, drainage, or persistent bleeding.  Images permanently stored and available for review in PACS.  Impression: Technically successful ultrasound guided injection.  Independent interpretation of notes and tests performed by another provider:   None.  Brief History, Exam, Impression, and Recommendations:    Primary osteoarthritis of both first carpometacarpal joints Pleasant 77 year old male, bilateral wrist pain, x-rays did show predominantly carpometacarpal osteoarthritis bilaterally. We had suspected a radiocarpal pain generator but the pain has now declared itself as mostly from the carpometacarpal joint. He has failed conservative treatment  occluding home conditioning, NSAIDs, we will proceed with bilateral first carpometacarpal injections today. If insufficient improvement we will target his carpal tunnel.  Peripheral neuropathy (HCC) Increasing peripheral neuropathic symptoms, he was historically controlled on oxcarbazepine, alpha lipoic acid, now having worsening symptoms so we will add gabapentin again, Voltaren gel.    ____________________________________________ Gwen Her. Dianah Field, M.D., ABFM., CAQSM., AME. Primary Care and Sports Medicine [place] [facility]  Adjunct Professor of [place] of [facility] of Medicine  Risk manager

## 2022-03-22 NOTE — Assessment & Plan Note (Signed)
Increasing peripheral neuropathic symptoms, he was historically controlled on oxcarbazepine, alpha lipoic acid, now having worsening symptoms so we will add gabapentin again, Voltaren gel.

## 2022-03-22 NOTE — Assessment & Plan Note (Signed)
Pleasant 77 year old male, bilateral wrist pain, x-rays did show predominantly carpometacarpal osteoarthritis bilaterally. We had suspected a radiocarpal pain generator but the pain has now declared itself as mostly from the carpometacarpal joint. He has failed conservative treatment occluding home conditioning, NSAIDs, we will proceed with bilateral first carpometacarpal injections today. If insufficient improvement we will target his carpal tunnel.

## 2022-03-22 NOTE — Addendum Note (Signed)
Addended by: Tarri Glenn A on: 03/22/2022 11:09 AM   Modules accepted: Orders

## 2022-06-21 ENCOUNTER — Ambulatory Visit (INDEPENDENT_AMBULATORY_CARE_PROVIDER_SITE_OTHER): Payer: Medicare PPO | Admitting: Sports Medicine

## 2022-06-21 DIAGNOSIS — Z0000 Encounter for general adult medical examination without abnormal findings: Secondary | ICD-10-CM | POA: Diagnosis not present

## 2022-06-21 NOTE — Patient Instructions (Signed)
MEDICARE ANNUAL WELLNESS VISIT Health Maintenance Summary and Written Plan of Care  [name] ,  Thank you for allowing me to perform your Medicare Annual Wellness Visit and for your ongoing commitment to your health.   Health Maintenance & Immunization History Health Maintenance  Topic Date Due   Zoster Vaccines- Shingrix (1 of 2) 09/21/2022 (Originally 06/29/1964)   Fecal DNA (Cologuard)  05/16/2023   Medicare Annual Wellness (AWV)  06/21/2023   Pneumonia Vaccine 76+ Years old  Completed   Hepatitis C Screening  Completed   HPV VACCINES  Aged Out   DTaP/Tdap/Td  Discontinued   INFLUENZA VACCINE  Discontinued   COLONOSCOPY (Pts 45-65yrs Insurance coverage will need to be confirmed)  Discontinued   COVID-19 Vaccine  Discontinued   Immunization History  Administered Date(s) Administered   Fluad Quad(high Dose 65+) 11/05/2018, 11/22/2019, 11/16/2020, 10/25/2021   Influenza Split 11/02/2016   Influenza, High Dose Seasonal PF 10/20/2017, 11/18/2019   Influenza,inj,Quad PF,6+ Mos 11/26/2014, 10/22/2015, 11/02/2016, 10/20/2017   Influenza-Unspecified 02/07/2010, 11/01/2010, 10/17/2011, 11/01/2012, 11/25/2013   Moderna Sars-Covid-2 Vaccination 05/12/2019, 06/11/2019, 01/10/2020, 05/27/2020   Pneumococcal Conjugate-13 11/25/2013, 11/02/2016   Pneumococcal Polysaccharide-23 11/05/2018   Tdap 02/07/2010, 08/31/2011    These are the patient goals that we discussed:  Goals Addressed               This Visit's Progress     Patient Stated (pt-stated)        Patient stated that he would like to continue to maintain his current lifestyle and see about another 15 years.         This is a list of Health Maintenance Items that are overdue or due now: Shingles vaccine - patient declined.    Orders/Referrals Placed Today: No orders of the defined types were placed in this encounter.  (Contact our referral department at [phone] if you have not spoken with someone about your  referral appointment within the next 5 days)    Follow-up Plan Follow-up with Monica Becton, MD as planned Medicare wellness visit in one year.  AVS printed and mailed to the patient.     Health Maintenance, Male Adopting a healthy lifestyle and getting preventive care are important in promoting health and wellness. Ask your health care provider about: The right schedule for you to have regular tests and exams. Things you can do on your own to prevent diseases and keep yourself healthy. What should I know about diet, weight, and exercise? Eat a healthy diet  Eat a diet that includes plenty of vegetables, fruits, low-fat dairy products, and lean protein. Do not eat a lot of foods that are high in solid fats, added sugars, or sodium. Maintain a healthy weight Body mass index (BMI) is a measurement that can be used to identify possible weight problems. It estimates body fat based on height and weight. Your health care provider can help determine your BMI and help you achieve or maintain a healthy weight. Get regular exercise Get regular exercise. This is one of the most important things you can do for your health. Most adults should: Exercise for at least 150 minutes each week. The exercise should increase your heart rate and make you sweat (moderate-intensity exercise). Do strengthening exercises at least twice a week. This is in addition to the moderate-intensity exercise. Spend less time sitting. Even light physical activity can be beneficial. Watch cholesterol and blood lipids Have your blood tested for lipids and cholesterol at 76 years of age, then have  this test every 5 years. You may need to have your cholesterol levels checked more often if: Your lipid or cholesterol levels are high. You are older than 76 years of age. You are at high risk for heart disease. What should I know about cancer screening? Many types of cancers can be detected early and may often be  prevented. Depending on your health history and family history, you may need to have cancer screening at various ages. This may include screening for: Colorectal cancer. Prostate cancer. Skin cancer. Lung cancer. What should I know about heart disease, diabetes, and high blood pressure? Blood pressure and heart disease High blood pressure causes heart disease and increases the risk of stroke. This is more likely to develop in people who have high blood pressure readings or are overweight. Talk with your health care provider about your target blood pressure readings. Have your blood pressure checked: Every 3-5 years if you are 76-31 years of age. Every year if you are 76 years old or older. If you are between the ages of 1 and 80 and are a current or former smoker, ask your health care provider if you should have a one-time screening for abdominal aortic aneurysm (AAA). Diabetes Have regular diabetes screenings. This checks your fasting blood sugar level. Have the screening done: Once every three years after age 76 if you are at a normal weight and have a low risk for diabetes. More often and at a younger age if you are overweight or have a high risk for diabetes. What should I know about preventing infection? Hepatitis B If you have a higher risk for hepatitis B, you should be screened for this virus. Talk with your health care provider to find out if you are at risk for hepatitis B infection. Hepatitis C Blood testing is recommended for: Everyone born from [birth year] through 1964. Anyone with known risk factors for hepatitis C. Sexually transmitted infections (STIs) You should be screened each year for STIs, including gonorrhea and chlamydia, if: You are sexually active and are younger than 76 years of age. You are older than 76 years of age and your health care provider tells you that you are at risk for this type of infection. Your sexual activity has changed since you were last screened,  and you are at increased risk for chlamydia or gonorrhea. Ask your health care provider if you are at risk. Ask your health care provider about whether you are at high risk for HIV. Your health care provider may recommend a prescription medicine to help prevent HIV infection. If you choose to take medicine to prevent HIV, you should first get tested for HIV. You should then be tested every 3 months for as long as you are taking the medicine. Follow these instructions at home: Alcohol use Do not drink alcohol if your health care provider tells you not to drink. If you drink alcohol: Limit how much you have to 0-2 drinks a day. Know how much alcohol is in your drink. In the [place] one drink equals one 12 oz bottle of beer (355 mL), one 5 oz glass of wine (148 mL), or one 1 oz glass of hard liquor (44 mL). Lifestyle Do not use any products that contain nicotine or tobacco. These products include cigarettes, chewing tobacco, and vaping devices, such as e-cigarettes. If you need help quitting, ask your health care provider. Do not use street drugs. Do not share needles. Ask your health care provider for help if you  need support or information about quitting drugs. General instructions Schedule regular health, dental, and eye exams. Stay current with your vaccines. Tell your health care provider if: You often feel depressed. You have ever been abused or do not feel safe at home. Summary Adopting a healthy lifestyle and getting preventive care are important in promoting health and wellness. Follow your health care provider's instructions about healthy diet, exercising, and getting tested or screened for diseases. Follow your health care provider's instructions on monitoring your cholesterol and blood pressure. This information is not intended to replace advice given to you by your health care provider. Make sure you discuss any questions you have with your health care provider. Document Revised:  06/15/2020 Document Reviewed: 06/15/2020 Elsevier Patient Education  2023 [employer].

## 2022-06-21 NOTE — Progress Notes (Signed)
MEDICARE ANNUAL WELLNESS VISIT  06/21/2022  Telephone Visit Disclaimer This Medicare AWV was conducted by telephone due to national recommendations for restrictions regarding the COVID-19 Pandemic (e.g. social distancing).  I verified, using two identifiers, that I am speaking with Michael Conrad or their authorized healthcare agent. I discussed the limitations, risks, security, and privacy concerns of performing an evaluation and management service by telephone and the potential availability of an in-person appointment in the future. The patient expressed understanding and agreed to proceed.  Location of Patient: Home Location of Provider (nurse):  In the office.  Subjective:    Michael Conrad is a 77 y.o. male patient of Thekkekandam, Ihor Austin, MD who had a Medicare Annual Wellness Visit today via telephone. Mckai is Retired and lives alone. he has 4 children. he reports that he is socially active and does interact with friends/family regularly. he is moderately physically active and enjoys yard work.  Patient Care Team: Monica Becton, MD as PCP - General (Family Medicine)     06/21/2022    8:39 AM 06/16/2021    9:11 AM 01/05/2015   11:09 AM 09/04/2014    8:11 AM 08/05/2014    9:05 AM  Advanced Directives  Does Patient Have a Medical Advance Directive? Yes Yes Yes Yes Yes  Type of Advance Directive Living will Living will;Healthcare Power of Attorney   Living will  Does patient want to make changes to medical advance directive? No - Patient declined No - Patient declined     Copy of Healthcare Power of Attorney in Chart?  No - copy requested  Yes     Hospital Utilization Over the Past 12 Months: # of hospitalizations or ER visits: 0 # of surgeries: 0  Review of Systems    Patient reports that his overall health is better compared to last year.  History obtained from chart review and the patient  Patient Reported Readings (BP, Pulse, CBG, Weight,  etc) none  Pain Assessment Pain : 0-10 Pain Score: 3  Pain Type: Chronic pain Pain Location: Hip Pain Orientation: Right Pain Descriptors / Indicators: Constant Pain Onset: More than a month ago Pain Frequency: Constant     Current Medications & Allergies (verified) Allergies as of 06/21/2022       Reactions   Dust Mite Extract Cough   Ambrosia Artemisiifolia (ragweed) Skin Test Cough   Other Cough   Pollen Extract Cough        Medication List        Accurate as of Jun 21, 2022  8:45 AM. If you have any questions, ask your nurse or doctor.          atorvastatin 40 MG tablet Commonly known as: LIPITOR TAKE 1 TABLET EVERY DAY AT 6PM   diclofenac Sodium 1 % Gel Commonly known as: VOLTAREN Apply 4 g topically 4 (four) times daily. To affected joint.   gabapentin 300 MG capsule Commonly known as: NEURONTIN One tab PO qHS for a week, then BID for a week, then TID. May double weekly to a max of 3,600mg /day   ibuprofen 800 MG tablet Commonly known as: ADVIL Take 1 tablet (800 mg total) by mouth 2 (two) times daily as needed.   irbesartan-hydrochlorothiazide 150-12.5 MG tablet Commonly known as: AVALIDE Take 0.5 tablets by mouth daily.   omeprazole 40 MG capsule Commonly known as: PRILOSEC TAKE 1 CAPSULE TWICE DAILY   Oxcarbazepine 300 MG tablet Commonly known as: TRILEPTAL Take 300 mg by  mouth 2 (two) times daily.   traMADol 50 MG tablet Commonly known as: ULTRAM TAKE 1 TO 2 TABLETS EVERY 8 HOURS, MAX 6 TABLETS PER DAY   traZODone 150 MG tablet Commonly known as: DESYREL TAKE 1 TABLET (150 MG TOTAL) BY MOUTH AT BEDTIME.        History (reviewed): Past Medical History:  Diagnosis Date   Hyperlipidemia    Hypertension    Past Surgical History:  Procedure Laterality Date   REVISION TOTAL HIP ARTHROPLASTY     ROTATOR CUFF REPAIR     TOTAL HIP ARTHROPLASTY Right 04/2021   [facility] [place] [facility],    Family History  Problem Relation Age of Onset    Heart disease Mother    Heart disease Father    Social History   Socioeconomic History   Marital status: Divorced    Spouse name: Not on file   Number of children: 4   Years of education: 12   Highest education level: 12th grade  Occupational History   Occupation: Retired  Tobacco Use   Smoking status: Never   Smokeless tobacco: Never  Vaping Use   Vaping Use: Never used  Substance and Sexual Activity   Alcohol use: Yes    Alcohol/week: 6.0 standard drinks of alcohol    Types: 6 Standard drinks or equivalent per week   Drug use: No   Sexual activity: Not Currently  Other Topics Concern   Not on file  Social History Narrative   Lives alone with his two cats. He has three children. He enjoys doing yard work when he can.   Social Determinants of Health   Financial Resource Strain: Low Risk  (06/21/2022)   Overall Financial Resource Strain (CARDIA)    Difficulty of Paying Living Expenses: Not hard at all  Food Insecurity: No Food Insecurity (06/21/2022)   Hunger Vital Sign    Worried About Running Out of Food in the Last Year: Never true    Ran Out of Food in the Last Year: Never true  Transportation Needs: No Transportation Needs (06/21/2022)   PRAPARE - Administrator, Civil Service (Medical): No    Lack of Transportation (Non-Medical): No  Physical Activity: Inactive (06/21/2022)   Exercise Vital Sign    Days of Exercise per Week: 0 days    Minutes of Exercise per Session: 0 min  Stress: No Stress Concern Present (06/21/2022)   [employer] of Occupational Health - Occupational Stress Questionnaire    Feeling of Stress : Not at all  Social Connections: Socially Isolated (06/21/2022)   Social Connection and Isolation Panel [NHANES]    Frequency of Communication with Friends and Family: Once a week    Frequency of Social Gatherings with Friends and Family: Twice a week    Attends Religious Services: Never    Database administrator or Organizations: No     Attends Banker Meetings: Never    Marital Status: Divorced    Activities of Daily Living    06/21/2022    8:41 AM  In your present state of health, do you have any difficulty performing the following activities:  Hearing? 0  Vision? 0  Difficulty concentrating or making decisions? 1  Comment some memory loss  Walking or climbing stairs? 0  Dressing or bathing? 0  Doing errands, shopping? 0  Preparing Food and eating ? N  Using the Toilet? N  In the past six months, have you accidently leaked urine? N  Do  you have problems with loss of bowel control? N  Managing your Medications? N  Managing your Finances? N  Housekeeping or managing your Housekeeping? N    Patient Education/ Literacy How often do you need to have someone help you when you read instructions, pamphlets, or other written materials from your doctor or pharmacy?: 1 - Never What is the last grade level you completed in school?: 12th grade  Exercise Current Exercise Habits: The patient does not participate in regular exercise at present, Exercise limited by: None identified  Diet Patient reports consuming  2-3  meals a day and 0 snack(s) a day Patient reports that his primary diet is: Regular Patient reports that she does have regular access to food.   Depression Screen    06/21/2022    8:39 AM 10/25/2021   10:37 AM 06/16/2021    9:11 AM 05/05/2020   11:43 AM 05/05/2020   11:34 AM 11/02/2016   10:18 AM  PHQ 2/9 Scores  PHQ - 2 Score 0 0 0 2 2 0  PHQ- 9 Score  0      Exception Documentation     Medical reason   Not completed     Patient did not want to discuss any further, we are starting an antidepressant and will do PHQ-9 at the follow-up visit.      Fall Risk    06/21/2022    8:39 AM 01/18/2022    9:57 AM 06/16/2021    9:11 AM 05/05/2020   11:43 AM 05/05/2020   11:35 AM  Fall Risk   Falls in the past year? 0 0 0 0 0  Number falls in past yr: 0 0 0  0  Injury with Fall? 0 0 0  0  Risk  for fall due to : No Fall Risks  No Fall Risks    Follow up Falls evaluation completed Falls evaluation completed Falls evaluation completed       Objective:  Michael Conrad seemed alert and oriented and he participated appropriately during our telephone visit.  Blood Pressure Weight BMI  BP Readings from Last 3 Encounters:  10/25/21 130/79  06/24/21 131/82  10/08/20 (!) 124/91   Wt Readings from Last 3 Encounters:  10/25/21 201 lb (91.2 kg)  08/12/21 204 lb (92.5 kg)  11/16/20 217 lb (98.4 kg)   BMI Readings from Last 1 Encounters:  10/25/21 27.26 kg/m    *Unable to obtain current vital signs, weight, and BMI due to telephone visit type  Hearing/Vision  Chrissie Noa did not seem to have difficulty with hearing/understanding during the telephone conversation Reports that he has had a formal eye exam by an eye care professional within the past year Reports that he has not had a formal hearing evaluation within the past year *Unable to fully assess hearing and vision during telephone visit type  Cognitive Function:    06/21/2022    8:42 AM 06/16/2021    9:17 AM  6CIT Screen  What Year? 0 points 0 points  What month? 0 points 0 points  What time? 0 points 0 points  Count back from 20 0 points 2 points  Months in reverse 4 points 2 points  Repeat phrase 0 points 0 points  Total Score 4 points 4 points   (Normal:0-7, Significant for Dysfunction: >8)  Normal Cognitive Function Screening: Yes   Immunization & Health Maintenance Record Immunization History  Administered Date(s) Administered   Fluad Quad(high Dose 65+) 11/05/2018, 11/22/2019, 11/16/2020, 10/25/2021   Influenza  Split 11/02/2016   Influenza, High Dose Seasonal PF 10/20/2017, 11/18/2019   Influenza,inj,Quad PF,6+ Mos 11/26/2014, 10/22/2015, 11/02/2016, 10/20/2017   Influenza-Unspecified 02/07/2010, 11/01/2010, 10/17/2011, 11/01/2012, 11/25/2013   Moderna Sars-Covid-2 Vaccination 05/12/2019, 06/11/2019,  01/10/2020, 05/27/2020   Pneumococcal Conjugate-13 11/25/2013, 11/02/2016   Pneumococcal Polysaccharide-23 11/05/2018   Tdap 02/07/2010, 08/31/2011    Health Maintenance  Topic Date Due   Zoster Vaccines- Shingrix (1 of 2) 09/21/2022 (Originally 06/29/1964)   Fecal DNA (Cologuard)  05/16/2023   Medicare Annual Wellness (AWV)  06/21/2023   Pneumonia Vaccine 45+ Years old  Completed   Hepatitis C Screening  Completed   HPV VACCINES  Aged Out   DTaP/Tdap/Td  Discontinued   INFLUENZA VACCINE  Discontinued   COLONOSCOPY (Pts 45-42yrs Insurance coverage will need to be confirmed)  Discontinued   COVID-19 Vaccine  Discontinued       Assessment  This is a routine wellness examination for [employer].  Health Maintenance: Due or Overdue There are no preventive care reminders to display for this patient.   Michael Conrad does not need a referral for Community Assistance: Care Management:   no Social Work:    no Prescription Assistance:  no Nutrition/Diabetes Education:  no   Plan:  Personalized Goals  Goals Addressed               This Visit's Progress     Patient Stated (pt-stated)        Patient stated that he would like to continue to maintain his current lifestyle and see about another 15 years.       Personalized Health Maintenance & Screening Recommendations  Shingles vaccine  - patient declined.  Lung Cancer Screening Recommended: no (Low Dose CT Chest recommended if Age 12-80 years, 30 pack-year currently smoking OR have quit w/in past 15 years) Hepatitis C Screening recommended: no HIV Screening recommended: no  Advanced Directives: Written information was not prepared per patient's request.  Referrals & Orders No orders of the defined types were placed in this encounter.   Follow-up Plan Follow-up with Monica Becton, MD as planned Medicare wellness visit in one year.  AVS printed and mailed to the patient.   I have personally  reviewed and noted the following in the patient's chart:   Medical and social history Use of alcohol, tobacco or illicit drugs  Current medications and supplements Functional ability and status Nutritional status Physical activity Advanced directives List of other physicians Hospitalizations, surgeries, and ER visits in previous 12 months Vitals Screenings to include cognitive, depression, and falls Referrals and appointments  In addition, I have reviewed and discussed with Michael Conrad certain preventive protocols, quality metrics, and best practice recommendations. A written personalized care plan for preventive services as well as general preventive health recommendations is available and can be mailed to the patient at his request.      Modesto Charon, RN BSN  06/21/2022

## 2022-07-07 ENCOUNTER — Other Ambulatory Visit: Payer: Self-pay | Admitting: Sports Medicine

## 2022-07-07 DIAGNOSIS — M1611 Unilateral primary osteoarthritis, right hip: Secondary | ICD-10-CM

## 2022-07-07 DIAGNOSIS — Z96641 Presence of right artificial hip joint: Secondary | ICD-10-CM

## 2022-08-10 ENCOUNTER — Other Ambulatory Visit: Payer: Self-pay | Admitting: Sports Medicine

## 2022-08-10 DIAGNOSIS — G6289 Other specified polyneuropathies: Secondary | ICD-10-CM

## 2022-11-03 ENCOUNTER — Other Ambulatory Visit: Payer: Self-pay | Admitting: Sports Medicine

## 2022-11-03 DIAGNOSIS — I10 Essential (primary) hypertension: Secondary | ICD-10-CM

## 2022-11-20 ENCOUNTER — Other Ambulatory Visit: Payer: Self-pay | Admitting: Sports Medicine

## 2022-12-01 DIAGNOSIS — H2513 Age-related nuclear cataract, bilateral: Secondary | ICD-10-CM | POA: Diagnosis not present

## 2022-12-01 DIAGNOSIS — H524 Presbyopia: Secondary | ICD-10-CM | POA: Diagnosis not present

## 2023-01-10 ENCOUNTER — Encounter: Payer: Self-pay | Admitting: Sports Medicine

## 2023-01-10 ENCOUNTER — Ambulatory Visit (INDEPENDENT_AMBULATORY_CARE_PROVIDER_SITE_OTHER): Payer: Medicare PPO | Admitting: Sports Medicine

## 2023-01-10 VITALS — BP 165/97 | HR 78

## 2023-01-10 DIAGNOSIS — R490 Dysphonia: Secondary | ICD-10-CM | POA: Diagnosis not present

## 2023-01-10 DIAGNOSIS — J302 Other seasonal allergic rhinitis: Secondary | ICD-10-CM

## 2023-01-10 DIAGNOSIS — I10 Essential (primary) hypertension: Secondary | ICD-10-CM

## 2023-01-10 MED ORDER — FEXOFENADINE HCL 180 MG PO TABS: 180.0000 mg | ORAL_TABLET | Freq: Every day | ORAL | 3 refills | Status: DC

## 2023-01-10 MED ORDER — OMEPRAZOLE 40 MG PO CPDR: 40.0000 mg | DELAYED_RELEASE_CAPSULE | Freq: Two times a day (BID) | ORAL | 3 refills | Status: DC

## 2023-01-10 MED ORDER — IRBESARTAN-HYDROCHLOROTHIAZIDE 150-12.5 MG PO TABS: 1.0000 | ORAL_TABLET | Freq: Every day | ORAL | 3 refills | Status: DC

## 2023-01-10 NOTE — Progress Notes (Addendum)
    Procedures performed today:    None.  Independent interpretation of notes and tests performed by another provider:   None.  Brief History, Exam, Impression, and Recommendations:    Hoarseness Annette Stable is a pleasant 77 year old male, he has had over a year of hoarseness, scratchiness in his throat and throat clearing particularly when speaking for long periods of time. Symptoms are particularly worse in the morning, no obvious sour brash. He does occasionally drink a beer or 2 at night, but typically 4 to 5 hours before he goes to sleep. He does not consume any liquids or food within 3 to 4 hours of bedtime. He is taking omeprazole low-dose. No melena, hematochezia. Never smoker. Neck exam is normal, oropharyngeal exam is normal. Unclear etiology. Question laryngeal reflux versus vocal cord pathology. Referral to ENT considering duration of symptomatology, I suspect he will need direct visualization. Increasing omeprazole to 40 mg twice daily.   Essential hypertension, benign Blood pressure continues to be elevated even on recheck, he has only been doing 1/2 tablet daily, he will go up to a full tablet daily and return in 2 weeks nurse visit blood pressure check.  I spent 40 minutes of total time managing this patient today, this includes chart review, face to face, and non-face to face time.  Patient had multiple questions and discussed multiple issues.  ____________________________________________ Ihor Austin. Benjamin Stain, M.D., ABFM., CAQSM., AME. Primary Care and Sports Medicine [facility]  Adjunct Professor of Family Medicine  [employer]  Restaurant manager, fast food

## 2023-01-10 NOTE — Assessment & Plan Note (Addendum)
Michael Conrad is a pleasant 77 year old male, he has had over a year of hoarseness, scratchiness in his throat and throat clearing particularly when speaking for long periods of time. Symptoms are particularly worse in the morning, no obvious sour brash. He does occasionally drink a beer or 2 at night, but typically 4 to 5 hours before he goes to sleep. He does not consume any liquids or food within 3 to 4 hours of bedtime. He is taking omeprazole low-dose. No melena, hematochezia. Never smoker. Neck exam is normal, oropharyngeal exam is normal. Unclear etiology. Question laryngeal reflux versus vocal cord pathology. Referral to ENT considering duration of symptomatology, I suspect he will need direct visualization. Increasing omeprazole to 40 mg twice daily.

## 2023-01-10 NOTE — Assessment & Plan Note (Signed)
Blood pressure continues to be elevated even on recheck, he has only been doing 1/2 tablet daily, he will go up to a full tablet daily and return in 2 weeks nurse visit blood pressure check.

## 2023-01-10 NOTE — Addendum Note (Signed)
Addended by: Monica Becton on: 01/10/2023 10:17 AM   Modules accepted: Orders, Level of Service

## 2023-03-17 DIAGNOSIS — H903 Sensorineural hearing loss, bilateral: Secondary | ICD-10-CM | POA: Diagnosis not present

## 2023-03-17 DIAGNOSIS — J309 Allergic rhinitis, unspecified: Secondary | ICD-10-CM | POA: Diagnosis not present

## 2023-03-17 DIAGNOSIS — R490 Dysphonia: Secondary | ICD-10-CM | POA: Diagnosis not present

## 2023-03-23 ENCOUNTER — Other Ambulatory Visit: Payer: Self-pay | Admitting: Sports Medicine

## 2023-03-23 DIAGNOSIS — M1611 Unilateral primary osteoarthritis, right hip: Secondary | ICD-10-CM

## 2023-03-23 DIAGNOSIS — Z96641 Presence of right artificial hip joint: Secondary | ICD-10-CM

## 2023-03-27 DIAGNOSIS — R202 Paresthesia of skin: Secondary | ICD-10-CM | POA: Diagnosis not present

## 2023-03-27 DIAGNOSIS — Z96641 Presence of right artificial hip joint: Secondary | ICD-10-CM | POA: Diagnosis not present

## 2023-03-27 DIAGNOSIS — Z471 Aftercare following joint replacement surgery: Secondary | ICD-10-CM | POA: Diagnosis not present

## 2023-06-27 ENCOUNTER — Ambulatory Visit (INDEPENDENT_AMBULATORY_CARE_PROVIDER_SITE_OTHER): Payer: Medicare PPO

## 2023-06-27 VITALS — Ht 72.0 in | Wt 204.0 lb

## 2023-06-27 DIAGNOSIS — Z0000 Encounter for general adult medical examination without abnormal findings: Secondary | ICD-10-CM

## 2023-06-27 NOTE — Progress Notes (Signed)
 Subjective:   Michael Conrad is a 78 y.o. male who presents for Medicare Annual/Subsequent preventive examination.  Visit Complete: Virtual I connected with  Megan T Reicks on 06/27/23 by a audio enabled telemedicine application and verified that I am speaking with the correct person using two identifiers.  Patient Location: Home  Provider Location: Office/Clinic  I discussed the limitations of evaluation and management by telemedicine. The patient expressed understanding and agreed to proceed.  Vital Signs: Because this visit was a virtual/telehealth visit, some criteria may be missing or patient reported. Any vitals not documented were not able to be obtained and vitals that have been documented are patient reported.  Patient Medicare AWV questionnaire was completed by the patient on n/a; I have confirmed that all information answered by patient is correct and no changes since this date.  Cardiac Risk Factors include: advanced age (>60men, >68 women);male gender;sedentary lifestyle;dyslipidemia;hypertension;family history of premature cardiovascular disease     Objective:    Today's Vitals   06/27/23 0852  Weight: 204 lb (92.5 kg)  Height: 6' (1.829 m)   Body mass index is 27.67 kg/m.     06/27/2023    9:03 AM 06/21/2022    8:39 AM 06/16/2021    9:11 AM 01/05/2015   11:09 AM 09/04/2014    8:11 AM 08/05/2014    9:05 AM  Advanced Directives  Does Patient Have a Medical Advance Directive? No Yes Yes Yes Yes Yes  Type of Advance Directive  Living will Living will;Healthcare Power of Attorney   Living will  Does patient want to make changes to medical advance directive?  No - Patient declined No - Patient declined     Copy of Healthcare Power of Attorney in Chart?   No - copy requested  Yes   Would patient like information on creating a medical advance directive? No - Patient declined         Current Medications (verified) Outpatient Encounter Medications as of  06/27/2023  Medication Sig   atorvastatin  (LIPITOR) 40 MG tablet TAKE 1 TABLET EVERY DAY AT 6PM   fexofenadine  (ALLEGRA ) 180 MG tablet Take 1 tablet (180 mg total) by mouth daily.   gabapentin  (NEURONTIN ) 300 MG capsule One tab PO qHS for a week, then BID for a week, then TID. May double weekly to a max of 3,600mg /day (Patient not taking: Reported on 06/27/2023)   ibuprofen  (ADVIL ) 800 MG tablet Take 1 tablet (800 mg total) by mouth 2 (two) times daily as needed. (Patient not taking: Reported on 06/27/2023)   irbesartan -hydrochlorothiazide  (AVALIDE) 150-12.5 MG tablet Take 1 tablet by mouth daily.   omeprazole  (PRILOSEC) 40 MG capsule Take 1 capsule (40 mg total) by mouth 2 (two) times daily.   Oxcarbazepine (TRILEPTAL) 300 MG tablet Take 300 mg by mouth 2 (two) times daily. (Patient not taking: Reported on 06/27/2023)   traMADol  (ULTRAM ) 50 MG tablet TAKE 1 TO 2 TABLETS EVERY 8 HOURS, MAX 6 TABLETS PER DAY   traZODone  (DESYREL ) 150 MG tablet TAKE 1 TABLET AT BEDTIME   [DISCONTINUED] diclofenac  Sodium (VOLTAREN ) 1 % GEL Apply 4 g topically 4 (four) times daily. To affected joint.   No facility-administered encounter medications on file as of 06/27/2023.    Allergies (verified) Dust mite extract, Ambrosia artemisiifolia (ragweed) skin test, Other, and Pollen extract   History: Past Medical History:  Diagnosis Date   Hyperlipidemia    Hypertension    Past Surgical History:  Procedure Laterality Date   REVISION TOTAL  HIP ARTHROPLASTY     ROTATOR CUFF REPAIR     TOTAL HIP ARTHROPLASTY Right 04/2021   [facility] [place], [place]   Family History  Problem Relation Age of Onset   Heart disease Mother    Heart disease Father    Social History   Socioeconomic History   Marital status: Divorced    Spouse name: Not on file   Number of children: 4   Years of education: 12   Highest education level: 12th grade  Occupational History   Occupation: Retired  Tobacco Use   Smoking status: Never    Smokeless tobacco: Never  Vaping Use   Vaping status: Never Used  Substance and Sexual Activity   Alcohol use: Yes    Alcohol/week: 6.0 standard drinks of alcohol    Types: 6 Standard drinks or equivalent per week   Drug use: No   Sexual activity: Not Currently  Other Topics Concern   Not on file  Social History Narrative   Lives alone with his two cats. He has three children. He enjoys doing yard work when he can.   Social Drivers of Corporate investment banker Strain: Low Risk  (06/27/2023)   Overall Financial Resource Strain (CARDIA)    Difficulty of Paying Living Expenses: Not hard at all  Food Insecurity: No Food Insecurity (06/27/2023)   Hunger Vital Sign    Worried About Running Out of Food in the Last Year: Never true    Ran Out of Food in the Last Year: Never true  Transportation Needs: No Transportation Needs (06/27/2023)   PRAPARE - Administrator, Civil Service (Medical): No    Lack of Transportation (Non-Medical): No  Physical Activity: Sufficiently Active (06/27/2023)   Exercise Vital Sign    Days of Exercise per Week: 4 days    Minutes of Exercise per Session: 40 min  Stress: No Stress Concern Present (06/27/2023)   [employer] of Occupational Health - Occupational Stress Questionnaire    Feeling of Stress : Not at all  Social Connections: Socially Isolated (06/27/2023)   Social Connection and Isolation Panel [NHANES]    Frequency of Communication with Friends and Family: More than three times a week    Frequency of Social Gatherings with Friends and Family: Twice a week    Attends Religious Services: Never    Database administrator or Organizations: No    Attends Engineer, structural: Never    Marital Status: Divorced    Tobacco Counseling Counseling given: Not Answered   Clinical Intake:  Pre-visit preparation completed: Yes  Pain : No/denies pain     BMI - recorded: 27.67 Nutritional Status: BMI 25 -29  Overweight Nutritional Risks: None Diabetes: No  How often do you need to have someone help you when you read instructions, pamphlets, or other written materials from your doctor or pharmacy?: 1 - Never What is the last grade level you completed in school?: 12  Interpreter Needed?: No      Activities of Daily Living    06/27/2023    8:56 AM  In your present state of health, do you have any difficulty performing the following activities:  Hearing? 0  Vision? 0  Difficulty concentrating or making decisions? 0  Walking or climbing stairs? 0  Dressing or bathing? 0  Doing errands, shopping? 0  Preparing Food and eating ? N  Using the Toilet? N  In the past six months, have you accidently leaked urine? N  Do you have problems with loss of bowel control? N  Managing your Medications? N  Managing your Finances? N  Housekeeping or managing your Housekeeping? N    Patient Care Team: Gean Keels, MD as PCP - General (Family Medicine)  Indicate any recent Medical Services you may have received from other than [facility] providers in the past year (date may be approximate).     Assessment:   This is a routine wellness examination for Michael Conrad.  Hearing/Vision screen No results found.   Goals Addressed             This Visit's Progress    Patient Stated       Michael Conrad would like to continue with a healthy lifestyle.       Depression Screen    06/27/2023    9:02 AM 01/10/2023    9:49 AM 06/21/2022    8:39 AM 10/25/2021   10:37 AM 06/16/2021    9:11 AM 05/05/2020   11:43 AM 05/05/2020   11:34 AM  PHQ 2/9 Scores  PHQ - 2 Score 0 0 0 0 0 2 2  PHQ- 9 Score  0  0     Exception Documentation       Medical reason  Not completed       Patient did not want to discuss any further, we are starting an antidepressant and will do PHQ-9 at the follow-up visit.    Fall Risk    06/27/2023    9:03 AM 06/21/2022    8:39 AM 01/18/2022    9:57 AM 06/16/2021    9:11 AM 05/05/2020    11:43 AM  Fall Risk   Falls in the past year? 0 0 0 0 0  Number falls in past yr: 0 0 0 0   Injury with Fall? 0 0 0 0   Risk for fall due to : No Fall Risks No Fall Risks  No Fall Risks   Follow up Falls evaluation completed Falls evaluation completed Falls evaluation completed Falls evaluation completed     MEDICARE RISK AT HOME: Medicare Risk at Home Any stairs in or around the home?: Yes If so, are there any without handrails?: Yes Home free of loose throw rugs in walkways, pet beds, electrical cords, etc?: Yes Adequate lighting in your home to reduce risk of falls?: Yes Life alert?: No Use of a cane, walker or w/c?: No Grab bars in the bathroom?: No Shower chair or bench in shower?: No Elevated toilet seat or a handicapped toilet?: No  TIMED UP AND GO:  Was the test performed?  No    Cognitive Function:        06/27/2023    9:05 AM 06/21/2022    8:42 AM 06/16/2021    9:17 AM  6CIT Screen  What Year? 0 points 0 points 0 points  What month? 0 points 0 points 0 points  What time? 0 points 0 points 0 points  Count back from 20 0 points 0 points 2 points  Months in reverse 0 points 4 points 2 points  Repeat phrase 0 points 0 points 0 points  Total Score 0 points 4 points 4 points    Immunizations Immunization History  Administered Date(s) Administered   Fluad Quad(high Dose 65+) 11/05/2018, 11/22/2019, 11/16/2020, 10/25/2021   Influenza Split 11/02/2016   Influenza, High Dose Seasonal PF 10/20/2017, 11/18/2019   Influenza,inj,Quad PF,6+ Mos 11/26/2014, 10/22/2015, 11/02/2016, 10/20/2017   Influenza-Unspecified 02/07/2010, 11/01/2010, 10/17/2011, 11/01/2012, 11/25/2013   Moderna  Sars-Covid-2 Vaccination 05/12/2019, 06/11/2019, 01/10/2020, 05/27/2020   Pneumococcal Conjugate-13 11/25/2013, 11/02/2016   Pneumococcal Polysaccharide-23 11/05/2018   Tdap 02/07/2010, 08/31/2011    TDAP status: Due, Education has been provided regarding the importance of this vaccine.  Advised may receive this vaccine at local pharmacy or Health Dept. Aware to provide a copy of the vaccination record if obtained from local pharmacy or Health Dept. Verbalized acceptance and understanding.  Flu Vaccine status: Declined, Education has been provided regarding the importance of this vaccine but patient still declined. Advised may receive this vaccine at local pharmacy or Health Dept. Aware to provide a copy of the vaccination record if obtained from local pharmacy or Health Dept. Verbalized acceptance and understanding.  Pneumococcal vaccine status: Up to date  Covid-19 vaccine status: Declined, Education has been provided regarding the importance of this vaccine but patient still declined. Advised may receive this vaccine at local pharmacy or Health Dept.or vaccine clinic. Aware to provide a copy of the vaccination record if obtained from local pharmacy or Health Dept. Verbalized acceptance and understanding.  Qualifies for Shingles Vaccine? Yes   Zostavax completed No   Shingrix Completed?: No.    Education has been provided regarding the importance of this vaccine. Patient has been advised to call insurance company to determine out of pocket expense if they have not yet received this vaccine. Advised may also receive vaccine at local pharmacy or Health Dept. Verbalized acceptance and understanding.  Screening Tests Health Maintenance  Topic Date Due   Zoster Vaccines- Shingrix (1 of 2) Never done   DTaP/Tdap/Td (3 - Td or Tdap) 01/10/2024 (Originally 08/30/2021)   INFLUENZA VACCINE  09/08/2023   Medicare Annual Wellness (AWV)  06/26/2024   Pneumonia Vaccine 49+ Years old  Completed   Hepatitis C Screening  Completed   HPV VACCINES  Aged Out   Meningococcal B Vaccine  Aged Out   Colonoscopy  Discontinued   COVID-19 Vaccine  Discontinued   Fecal DNA (Cologuard)  Discontinued    Health Maintenance  Health Maintenance Due  Topic Date Due   Zoster Vaccines- Shingrix (1 of  2) Never done    Colorectal cancer screening: No longer required.   Lung Cancer Screening: (Low Dose CT Chest recommended if Age 98-80 years, 20 pack-year currently smoking OR have quit w/in 15years.) does not qualify.   Lung Cancer Screening Referral: n/a  Additional Screening:  Hepatitis C Screening: does not qualify; Completed 07/25/2016  Vision Screening: Recommended annual ophthalmology exams for early detection of glaucoma and other disorders of the eye. Is the patient up to date with their annual eye exam?  Yes  Who is the provider or what is the name of the office in which the patient attends annual eye exams? [facility] If pt is not established with a provider, would they like to be referred to a provider to establish care? N/a.   Dental Screening: Recommended annual dental exams for proper oral hygiene    Community Resource Referral / Chronic Care Management: CRR required this visit?  No   CCM required this visit?  No     Plan:     I have personally reviewed and noted the following in the patient's chart:   Medical and social history Use of alcohol, tobacco or illicit drugs  Current medications and supplements including opioid prescriptions. Patient is currently taking opioid prescriptions. Information provided to patient regarding non-opioid alternatives. Patient advised to discuss non-opioid treatment plan with their provider. Functional ability and status Nutritional status  Physical activity Advanced directives List of other physicians Hospitalizations, surgeries, and ER visits in previous 12 months Vitals Screenings to include cognitive, depression, and falls Referrals and appointments  In addition, I have reviewed and discussed with patient certain preventive protocols, quality metrics, and best practice recommendations. A written personalized care plan for preventive services as well as general preventive health recommendations were provided to patient.      Aubrey Leaf, CMA   06/27/2023   After Visit Summary: (Declined) Due to this being a telephonic visit, with patients personalized plan was offered to patient but patient Declined AVS at this time   Nurse Notes:   Michael Conrad is a 78 y.o. male patient of Thekkekandam, Joselyn Nicely, MD who had a Medicare Annual Wellness Visit today via telephone. Michael Conrad is Retired and lives alone. He has 4 children. he reports that he is socially active and does interact with friends/family regularly. He is moderately physically active and enjoys yard work.

## 2023-06-27 NOTE — Patient Instructions (Signed)
  Michael Conrad , Thank you for taking time to come for your Medicare Wellness Visit. I appreciate your ongoing commitment to your health goals. Please review the following plan we discussed and let me know if I can assist you in the future.   These are the goals we discussed:  Goals       Patient Stated (pt-stated)      "Continue to stay healthy and see another year".      Patient Stated (pt-stated)      Patient stated that he would like to continue to maintain his current lifestyle and see about another 15 years.      Patient Stated      [name] would like to continue with a healthy lifestyle.         This is a list of the screening recommended for you and due dates:  Health Maintenance  Topic Date Due   Zoster (Shingles) Vaccine (1 of 2) Never done   DTaP/Tdap/Td vaccine (3 - Td or Tdap) 01/10/2024*   Flu Shot  09/08/2023   Medicare Annual Wellness Visit  06/26/2024   Pneumonia Vaccine  Completed   Hepatitis C Screening  Completed   HPV Vaccine  Aged Out   Meningitis B Vaccine  Aged Out   Colon Cancer Screening  Discontinued   COVID-19 Vaccine  Discontinued   Cologuard (Stool DNA test)  Discontinued  *Topic was postponed. The date shown is not the original due date.

## 2023-07-24 ENCOUNTER — Ambulatory Visit (INDEPENDENT_AMBULATORY_CARE_PROVIDER_SITE_OTHER): Admitting: Sports Medicine

## 2023-07-24 VITALS — BP 122/67 | HR 96 | Resp 20 | Ht 72.0 in

## 2023-07-24 DIAGNOSIS — I10 Essential (primary) hypertension: Secondary | ICD-10-CM | POA: Diagnosis not present

## 2023-07-24 DIAGNOSIS — G6289 Other specified polyneuropathies: Secondary | ICD-10-CM | POA: Diagnosis not present

## 2023-07-24 DIAGNOSIS — R739 Hyperglycemia, unspecified: Secondary | ICD-10-CM

## 2023-07-24 DIAGNOSIS — Z0000 Encounter for general adult medical examination without abnormal findings: Secondary | ICD-10-CM

## 2023-07-24 MED ORDER — TRAZODONE HCL 150 MG PO TABS: 150.0000 mg | ORAL_TABLET | Freq: Every day | ORAL | 3 refills | Status: DC

## 2023-07-24 NOTE — Assessment & Plan Note (Signed)
 Blood pressure is well-controlled, pulse rate is a touch high, he has been doing some work outside, I did explain to him that this was evidence that he was likely volume depleted, he will switch from drinking excessive amounts of water and coffee to adding salt containing beverages such as Gatorade. Return to see me as needed.

## 2023-07-24 NOTE — Assessment & Plan Note (Signed)
 Getting routine labs today, he has declined Tdap and Shingrix in the past.

## 2023-07-24 NOTE — Progress Notes (Signed)
    Procedures performed today:    None.  Independent interpretation of notes and tests performed by another provider:   None.  Brief History, Exam, Impression, and Recommendations:    Essential hypertension, benign Blood pressure is well-controlled, pulse rate is a touch high, he has been doing some work outside, I did explain to him that this was evidence that he was likely volume depleted, he will switch from drinking excessive amounts of water and coffee to adding salt containing beverages such as Gatorade. Return to see me as needed.  Annual physical exam Getting routine labs today, he has declined Tdap and Shingrix in the past.    ____________________________________________ Joselyn Nicely. Sandy Crumb, M.D., ABFM., CAQSM., AME. Primary Care and Sports Medicine [facility]  Adjunct Professor of [facility] Medicine  [employer]  Restaurant manager, fast food

## 2023-07-25 ENCOUNTER — Ambulatory Visit: Payer: Self-pay | Admitting: Sports Medicine

## 2023-07-25 ENCOUNTER — Telehealth: Payer: Self-pay

## 2023-07-25 LAB — CBC
Hematocrit: 42.5 % (ref 37.5–51.0)
Hemoglobin: 14.3 g/dL (ref 13.0–17.7)
MCH: 31 pg (ref 26.6–33.0)
MCHC: 33.6 g/dL (ref 31.5–35.7)
MCV: 92 fL (ref 79–97)
Platelets: 302 10*3/uL (ref 150–450)
RBC: 4.61 x10E6/uL (ref 4.14–5.80)
RDW: 12.5 % (ref 11.6–15.4)
WBC: 7.5 10*3/uL (ref 3.4–10.8)

## 2023-07-25 LAB — COMPREHENSIVE METABOLIC PANEL WITH GFR
ALT: 21 IU/L (ref 0–44)
AST: 21 IU/L (ref 0–40)
Albumin: 4.7 g/dL (ref 3.8–4.8)
Alkaline Phosphatase: 84 IU/L (ref 44–121)
BUN/Creatinine Ratio: 12 (ref 10–24)
BUN: 13 mg/dL (ref 8–27)
Bilirubin Total: 0.5 mg/dL (ref 0.0–1.2)
CO2: 20 mmol/L (ref 20–29)
Calcium: 9 mg/dL (ref 8.6–10.2)
Chloride: 100 mmol/L (ref 96–106)
Creatinine, Ser: 1.09 mg/dL (ref 0.76–1.27)
Globulin, Total: 1.8 g/dL (ref 1.5–4.5)
Glucose: 119 mg/dL — ABNORMAL HIGH (ref 70–99)
Potassium: 3.6 mmol/L (ref 3.5–5.2)
Sodium: 138 mmol/L (ref 134–144)
Total Protein: 6.5 g/dL (ref 6.0–8.5)
eGFR: 70 mL/min/{1.73_m2} (ref >59)

## 2023-07-25 LAB — LIPID PANEL
Chol/HDL Ratio: 3.8 ratio (ref 0.0–5.0)
Cholesterol, Total: 156 mg/dL (ref 100–199)
HDL: 41 mg/dL (ref >39)
LDL Chol Calc (NIH): 73 mg/dL (ref 0–99)
Triglycerides: 260 mg/dL — ABNORMAL HIGH (ref 0–149)
VLDL Cholesterol Cal: 42 mg/dL — ABNORMAL HIGH (ref 5–40)

## 2023-07-25 LAB — HEMOGLOBIN A1C
Est. average glucose Bld gHb Est-mCnc: 123 mg/dL
Hgb A1c MFr Bld: 5.9 % — ABNORMAL HIGH (ref 4.8–5.6)

## 2023-07-25 LAB — TSH: TSH: 1.63 u[IU]/mL (ref 0.450–4.500)

## 2023-07-25 NOTE — Telephone Encounter (Signed)
 Per E2C2, patient is requesting a call back from [facility] regarding his results. Thanks in advance.

## 2023-07-25 NOTE — Telephone Encounter (Signed)
 Patient has been contacted and advised of results. Thank you.

## 2023-10-12 ENCOUNTER — Encounter: Payer: Self-pay | Admitting: Sports Medicine

## 2023-10-23 ENCOUNTER — Telehealth: Payer: Self-pay

## 2023-10-23 NOTE — Telephone Encounter (Signed)
 Copied from CRM [id number]. Topic: Appointments - Appointment Scheduling >> Oct 23, 2023 11:53 AM Diannia H wrote: Patient/patient representative is calling to schedule an appointment.  Dr. ONEIDA patient, patient callback number is [phone].

## 2023-10-23 NOTE — Telephone Encounter (Signed)
 Would you please assist this patient in scheduing an appt.?

## 2023-11-14 ENCOUNTER — Other Ambulatory Visit (HOSPITAL_BASED_OUTPATIENT_CLINIC_OR_DEPARTMENT_OTHER): Payer: Self-pay

## 2023-11-14 DIAGNOSIS — M1611 Unilateral primary osteoarthritis, right hip: Secondary | ICD-10-CM

## 2023-11-14 DIAGNOSIS — Z96641 Presence of right artificial hip joint: Secondary | ICD-10-CM

## 2023-11-14 MED ORDER — TRAMADOL HCL 50 MG PO TABS: ORAL_TABLET | ORAL | 0 refills | Status: AC

## 2023-11-23 ENCOUNTER — Encounter: Payer: Self-pay | Admitting: Medical-Surgical

## 2023-11-23 ENCOUNTER — Other Ambulatory Visit: Payer: Self-pay

## 2023-11-23 DIAGNOSIS — Z1211 Encounter for screening for malignant neoplasm of colon: Secondary | ICD-10-CM

## 2023-11-29 ENCOUNTER — Telehealth: Payer: Self-pay

## 2023-11-29 NOTE — Telephone Encounter (Signed)
 Called [facility] -  Cancelled cologuard order  Was told that kit had already shipped and patient should just discard it.   Spoke with the patient. He states when [employer] delivered the shipment that he declined it and he was told [employer] cancelled it and the kit was sent back with [employer] .

## 2023-11-29 NOTE — Telephone Encounter (Signed)
 Copied from CRM [id number]. Topic: Clinical - Prescription Issue >> Nov 29, 2023 12:05 PM Nathanel BROCKS wrote: Reason for CRM: pt called and stated that he did not want to do the colorguard and to cancel the test being sent to him.

## 2023-12-31 IMAGING — DX DG KNEE 1-2V*L*
2 series · 2 of 2 positions shown · non-contrast
Comparison: None.

CLINICAL DATA: Generalized right knee pain for years.

EXAM:
RIGHT KNEE - COMPLETE 4+ VIEW; LEFT KNEE - 1-2 VIEW

[knee ap bilat standing (1 of 2)]
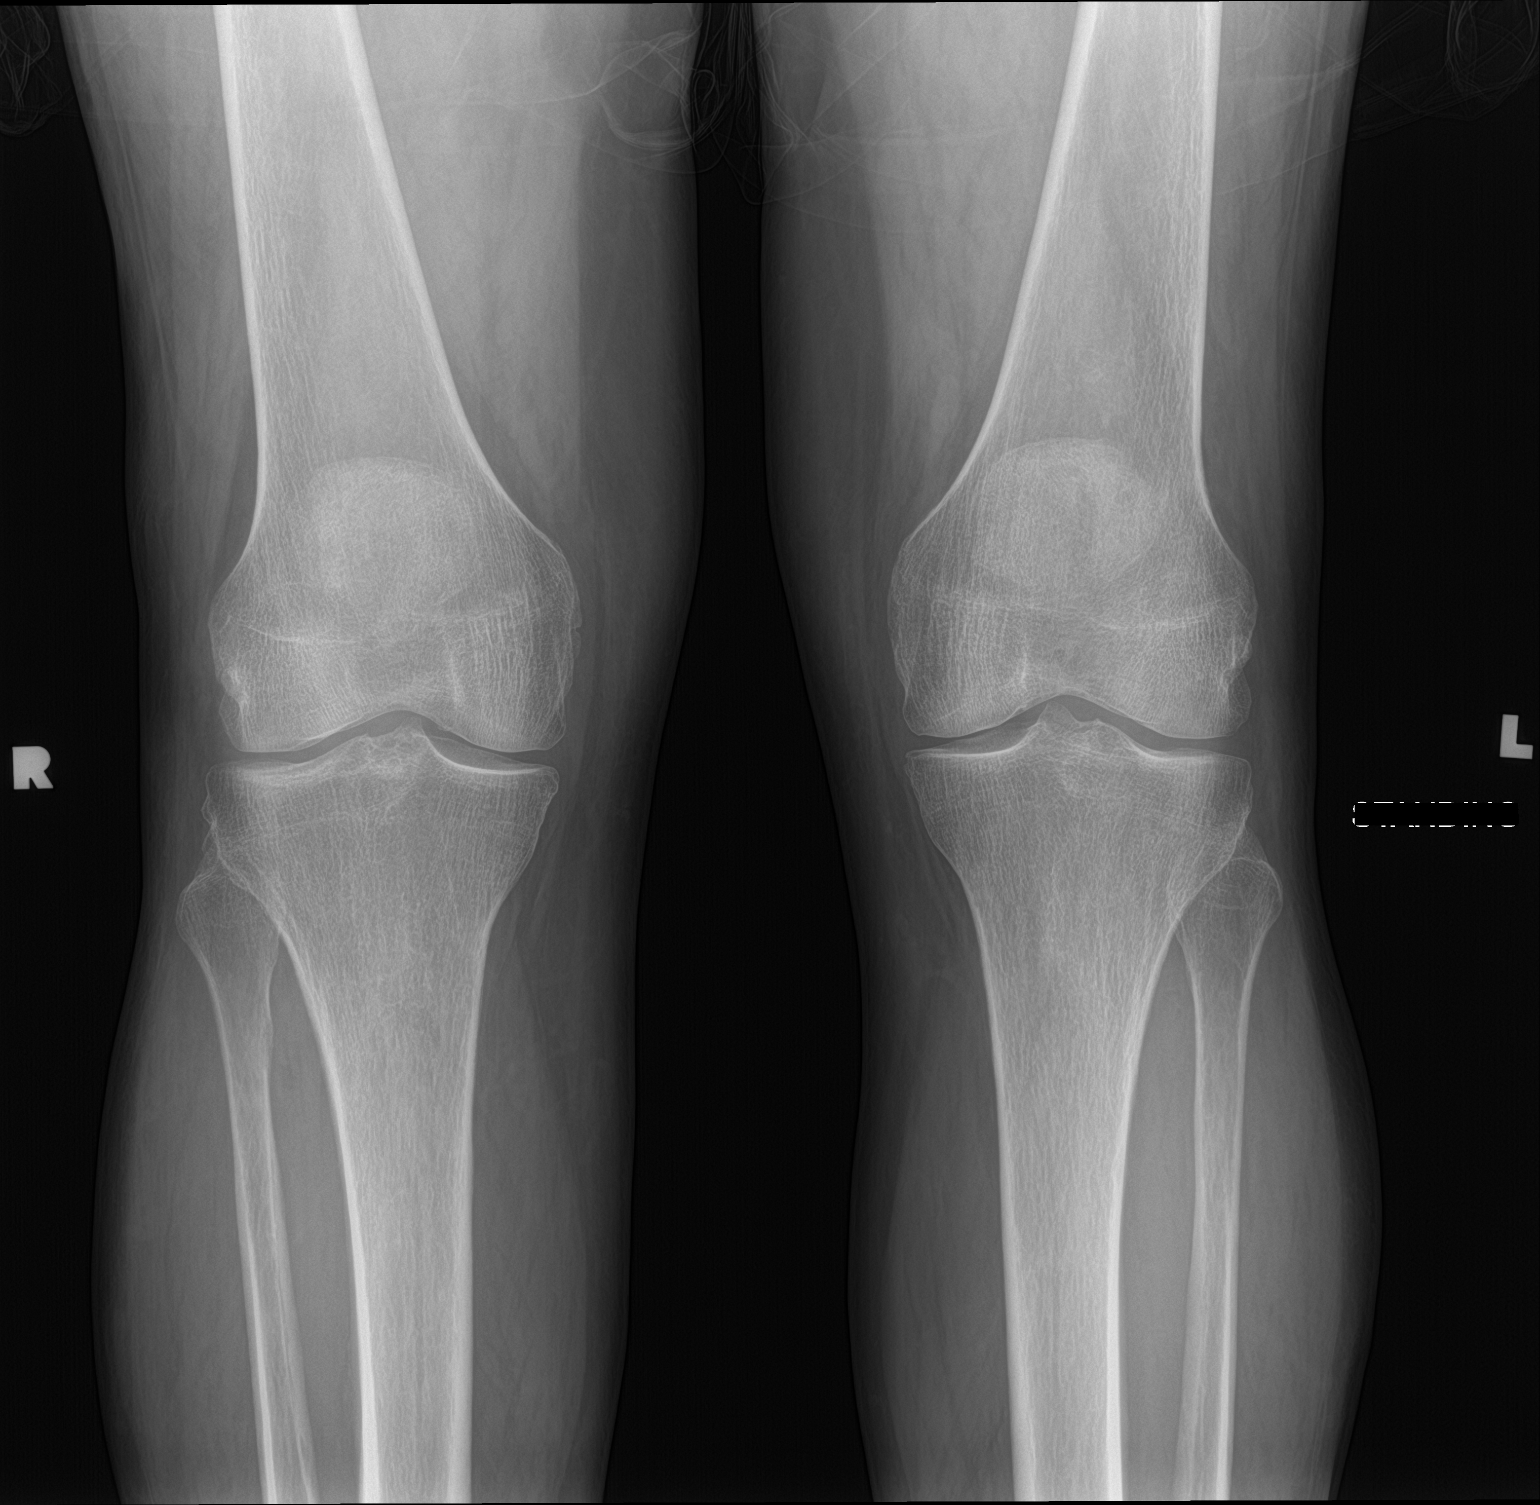

[knee ap bilat standing (2 of 2)]
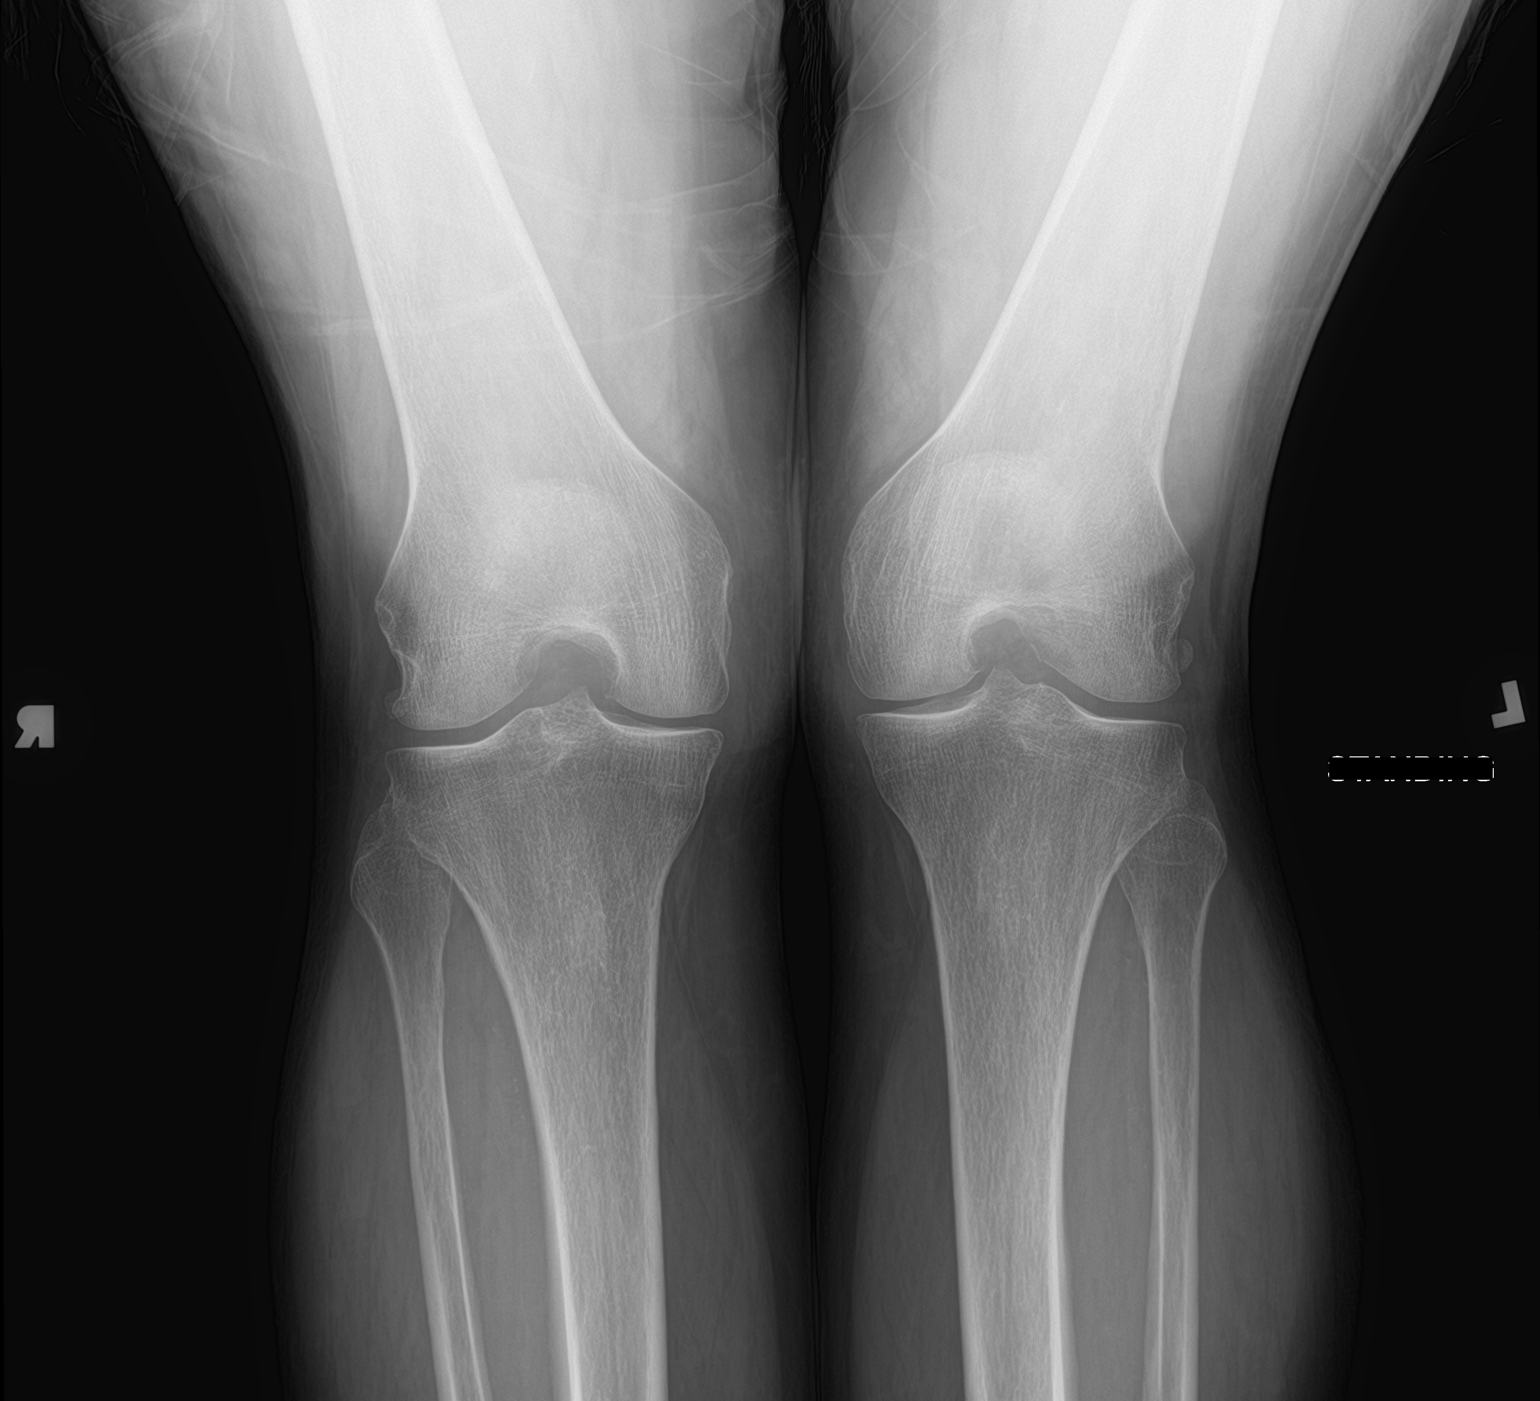

[2 of 2 positions shown; findings below may reference images not displayed]

FINDINGS: Right knee:

Mild medial greater than lateral compartment joint space narrowing.
No joint effusion. No acute fracture or dislocation.

PA and AP views of the left knee demonstrate mild medial compartment
joint space narrowing.
IMPRESSION: Right knee mild medial greater than lateral compartment joint space
narrowing.

## 2023-12-31 IMAGING — DX DG KNEE COMPLETE 4+V*R*
4 series · 4 of 4 positions shown · non-contrast
Comparison: None.

CLINICAL DATA: Generalized right knee pain for years.

EXAM:
RIGHT KNEE - COMPLETE 4+ VIEW; LEFT KNEE - 1-2 VIEW

[knee lat]
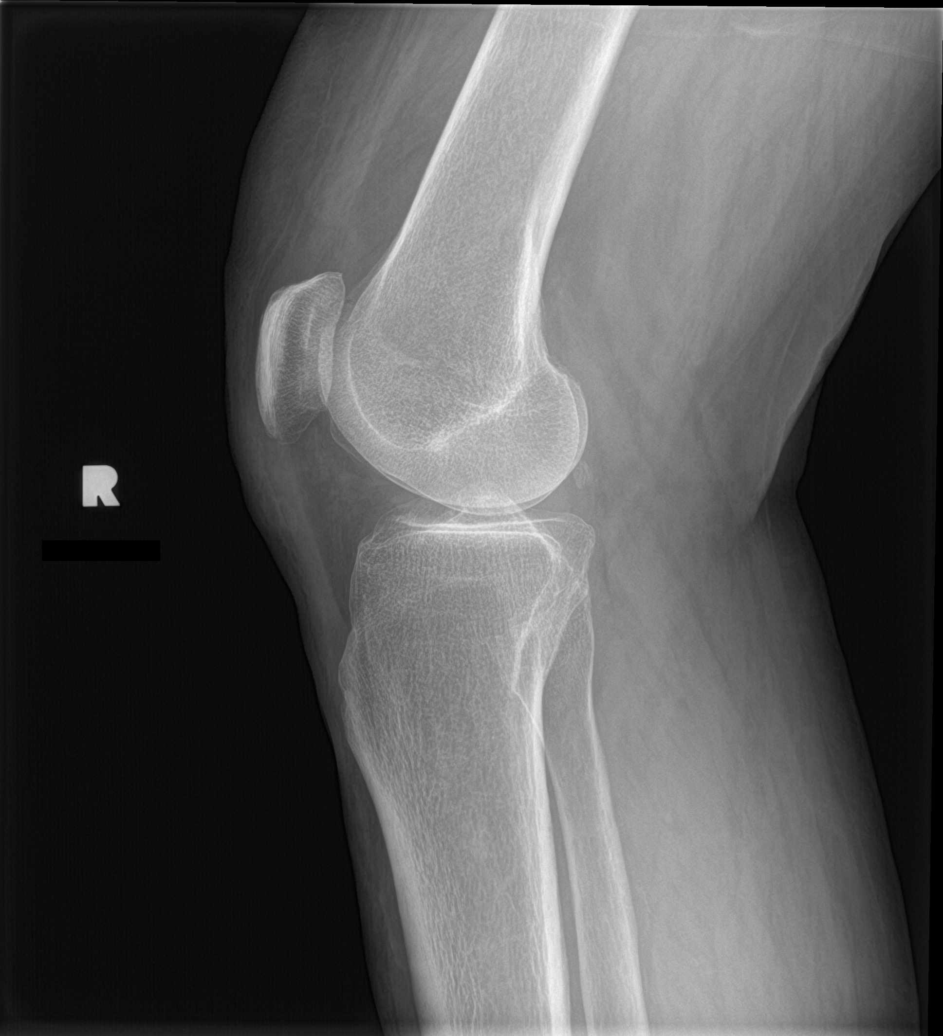

[knee ap bilat standing (1 of 2)]
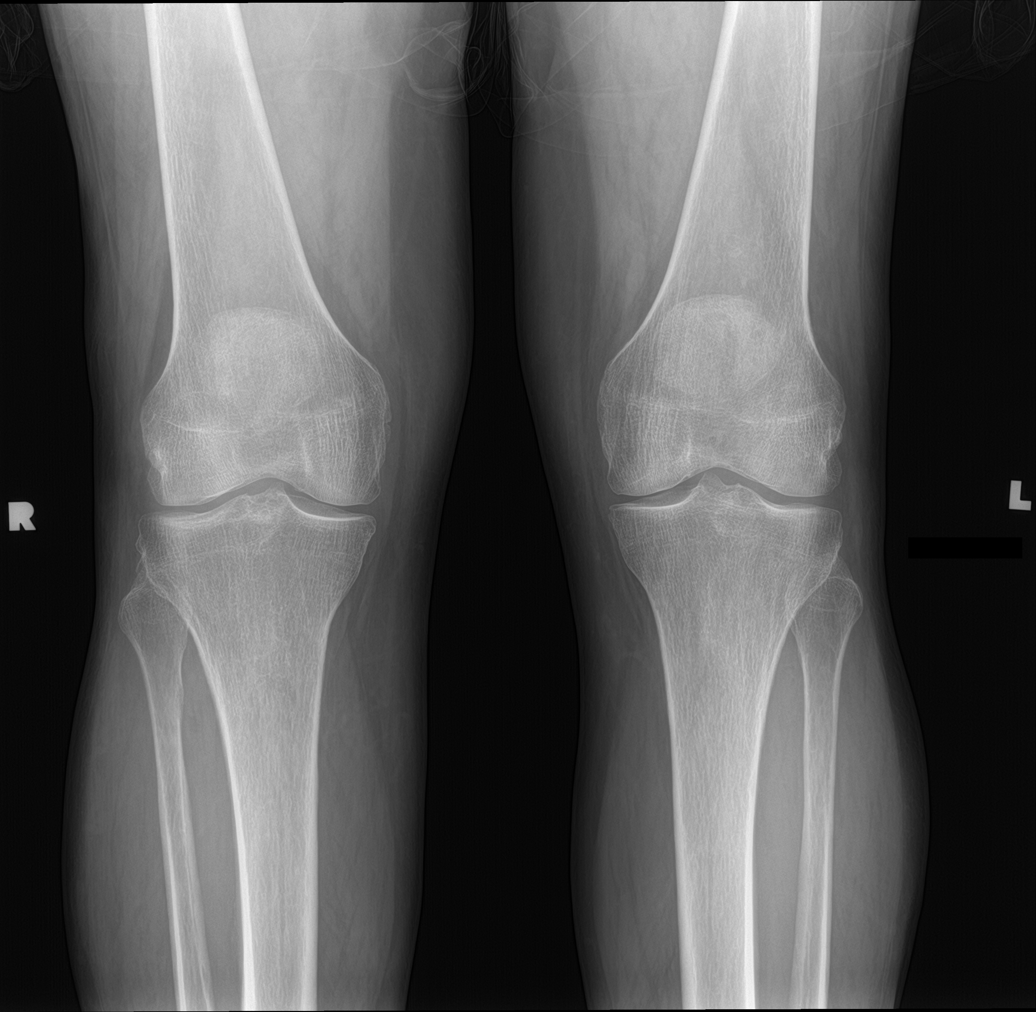

[knee sunrise standing]
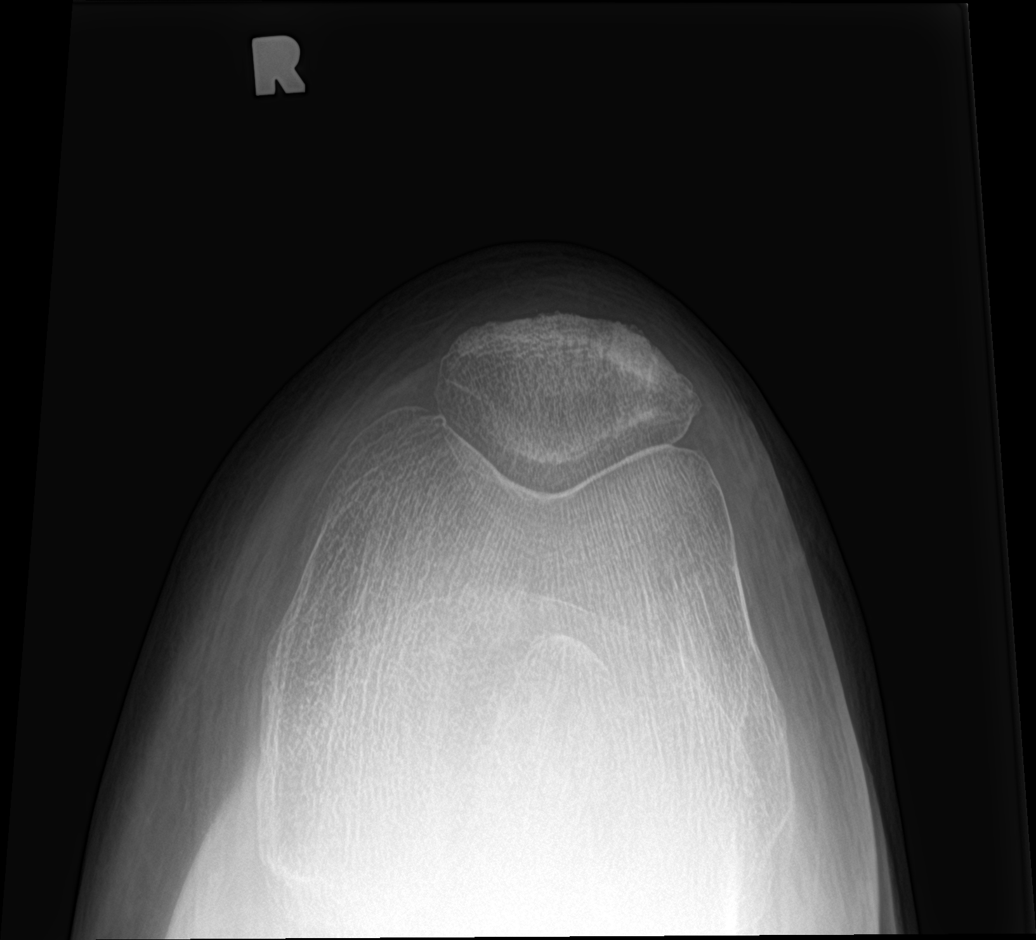

[knee ap bilat standing (2 of 2)]
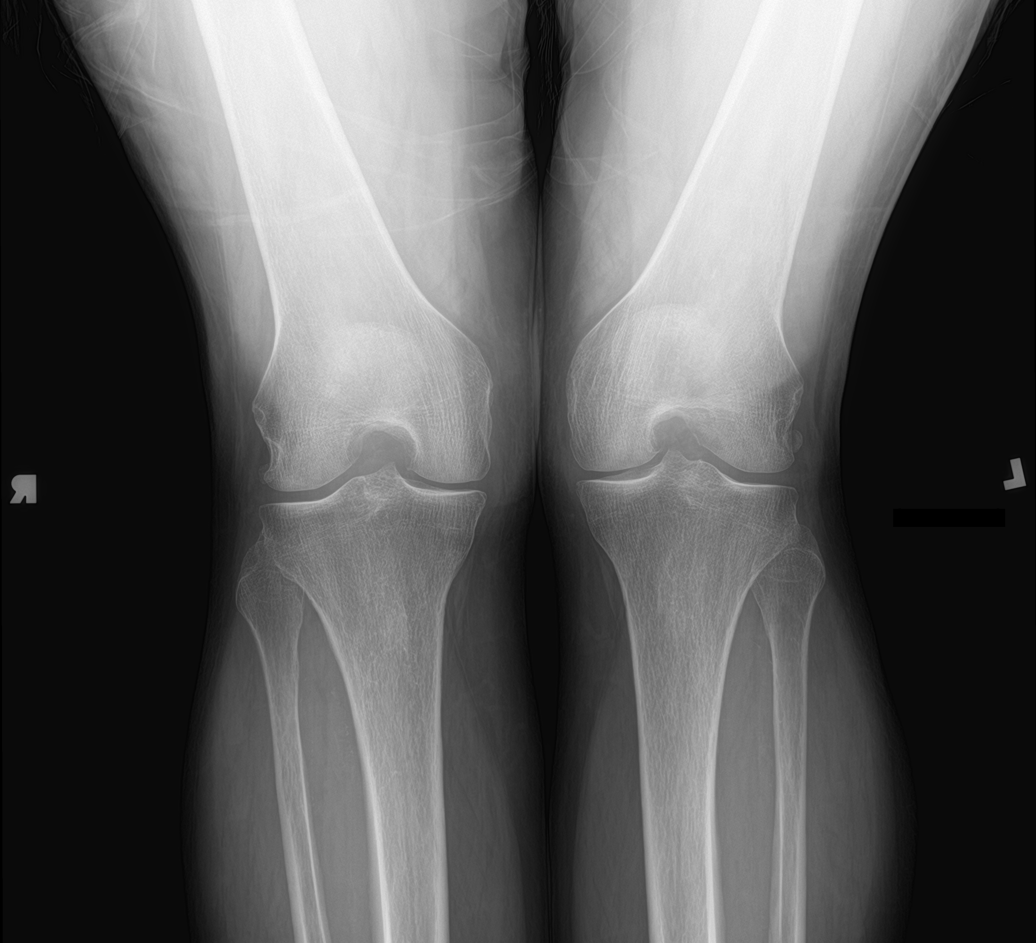

[4 of 4 positions shown; findings below may reference images not displayed]

FINDINGS: Right knee:

Mild medial greater than lateral compartment joint space narrowing.
No joint effusion. No acute fracture or dislocation.

PA and AP views of the left knee demonstrate mild medial compartment
joint space narrowing.
IMPRESSION: Right knee mild medial greater than lateral compartment joint space
narrowing.

## 2024-01-03 ENCOUNTER — Other Ambulatory Visit (HOSPITAL_BASED_OUTPATIENT_CLINIC_OR_DEPARTMENT_OTHER): Payer: Self-pay | Admitting: Family Medicine

## 2024-01-03 DIAGNOSIS — M1611 Unilateral primary osteoarthritis, right hip: Secondary | ICD-10-CM

## 2024-01-03 DIAGNOSIS — Z96641 Presence of right artificial hip joint: Secondary | ICD-10-CM

## 2024-01-03 NOTE — Telephone Encounter (Signed)
 Pls contact pt to establish with new PCP. Thx

## 2024-01-14 DIAGNOSIS — R7303 Prediabetes: Secondary | ICD-10-CM | POA: Insufficient documentation

## 2024-01-14 NOTE — Progress Notes (Unsigned)

## 2024-01-15 ENCOUNTER — Encounter: Payer: Self-pay | Admitting: Medical-Surgical

## 2024-01-15 ENCOUNTER — Ambulatory Visit: Admitting: Medical-Surgical

## 2024-01-15 VITALS — BP 147/93 | HR 72 | Ht 72.0 in | Wt 208.8 lb

## 2024-01-15 DIAGNOSIS — E785 Hyperlipidemia, unspecified: Secondary | ICD-10-CM

## 2024-01-15 DIAGNOSIS — G6289 Other specified polyneuropathies: Secondary | ICD-10-CM

## 2024-01-15 DIAGNOSIS — R7303 Prediabetes: Secondary | ICD-10-CM | POA: Diagnosis not present

## 2024-01-15 DIAGNOSIS — I10 Essential (primary) hypertension: Secondary | ICD-10-CM

## 2024-01-15 DIAGNOSIS — R490 Dysphonia: Secondary | ICD-10-CM

## 2024-01-15 DIAGNOSIS — C61 Malignant neoplasm of prostate: Secondary | ICD-10-CM

## 2024-01-15 DIAGNOSIS — F5101 Primary insomnia: Secondary | ICD-10-CM

## 2024-01-15 DIAGNOSIS — F330 Major depressive disorder, recurrent, mild: Secondary | ICD-10-CM

## 2024-01-15 DIAGNOSIS — Z7689 Persons encountering health services in other specified circumstances: Secondary | ICD-10-CM

## 2024-01-15 MED ORDER — ATORVASTATIN CALCIUM 40 MG PO TABS: 40.0000 mg | ORAL_TABLET | Freq: Every day | ORAL | 3 refills | Status: AC

## 2024-01-15 MED ORDER — TRAZODONE HCL 150 MG PO TABS: 150.0000 mg | ORAL_TABLET | Freq: Every day | ORAL | 3 refills | Status: AC

## 2024-01-15 MED ORDER — OMEPRAZOLE 40 MG PO CPDR: 40.0000 mg | DELAYED_RELEASE_CAPSULE | Freq: Two times a day (BID) | ORAL | 3 refills | Status: AC

## 2024-01-15 MED ORDER — IRBESARTAN-HYDROCHLOROTHIAZIDE 150-12.5 MG PO TABS: 1.0000 | ORAL_TABLET | Freq: Every day | ORAL | 3 refills | Status: AC

## 2024-01-15 NOTE — Assessment & Plan Note (Signed)
 Managed with trazodone , effective at night. - Continue trazodone  150mg  nightly for insomnia.

## 2024-01-15 NOTE — Assessment & Plan Note (Signed)
 Blood pressure elevated today, typically 130-140 mmHg at home. - Rechecked blood pressure, still a bit elevated. - Home readings acceptable, continue monitoring. - Continue irbesartan -hctz as prescribed.

## 2024-01-15 NOTE — Assessment & Plan Note (Signed)
 Long history of prediabetes with last A1c done in June. Declines A1c testing today stating he doesn't want to know.  - Declined A1c testing.

## 2024-01-15 NOTE — Assessment & Plan Note (Signed)
 Managed with atorvastatin  and dietary modifications. - Continue atorvastatin  40mg  nightly as prescribed.

## 2024-01-15 NOTE — Assessment & Plan Note (Signed)
 Not interested in further testing or treatment. - No further testing or treatment per patient wishes.

## 2024-01-15 NOTE — Assessment & Plan Note (Signed)
 Managed with omeprazole  and dietary monitoring.  - Continue omeprazole  40mg  at night.

## 2024-01-15 NOTE — Assessment & Plan Note (Signed)
 Chronic condition, prefers pain management without regular medication. - Continue ibuprofen  as needed.

## 2024-01-23 ENCOUNTER — Encounter: Admitting: Sports Medicine

## 2024-06-27 ENCOUNTER — Ambulatory Visit

## 2024-07-15 ENCOUNTER — Ambulatory Visit: Admitting: Medical-Surgical
